# Patient Record
Sex: Female | Born: 1960 | Race: White | Hispanic: No | Smoking: Current every day smoker
Health system: Southern US, Community
[De-identification: ages and names within clinical notes are randomized; demographics above are authoritative.]

## PROBLEM LIST (undated history)

## (undated) DIAGNOSIS — N946 Dysmenorrhea, unspecified: Secondary | ICD-10-CM

## (undated) DIAGNOSIS — R140 Abdominal distension (gaseous): Secondary | ICD-10-CM

## (undated) DIAGNOSIS — D649 Anemia, unspecified: Secondary | ICD-10-CM

## (undated) DIAGNOSIS — E220 Acromegaly and pituitary gigantism: Secondary | ICD-10-CM

## (undated) DIAGNOSIS — N898 Other specified noninflammatory disorders of vagina: Secondary | ICD-10-CM

## (undated) DIAGNOSIS — K869 Disease of pancreas, unspecified: Secondary | ICD-10-CM

## (undated) DIAGNOSIS — D219 Benign neoplasm of connective and other soft tissue, unspecified: Secondary | ICD-10-CM

## (undated) DIAGNOSIS — R5383 Other fatigue: Secondary | ICD-10-CM

## (undated) DIAGNOSIS — Z780 Asymptomatic menopausal state: Secondary | ICD-10-CM

## (undated) DIAGNOSIS — F32A Depression, unspecified: Secondary | ICD-10-CM

## (undated) DIAGNOSIS — N950 Postmenopausal bleeding: Secondary | ICD-10-CM

## (undated) DIAGNOSIS — F329 Major depressive disorder, single episode, unspecified: Secondary | ICD-10-CM

## (undated) DIAGNOSIS — N921 Excessive and frequent menstruation with irregular cycle: Principal | ICD-10-CM

## (undated) HISTORY — DX: Postmenopausal bleeding: N95.0

## (undated) HISTORY — DX: Abdominal distension (gaseous): R14.0

## (undated) HISTORY — DX: Other fatigue: R53.83

## (undated) HISTORY — DX: Benign neoplasm of connective and other soft tissue, unspecified: D21.9

## (undated) HISTORY — PX: OTHER SURGICAL HISTORY: SHX169

## (undated) HISTORY — DX: Other specified noninflammatory disorders of vagina: N89.8

## (undated) HISTORY — DX: Asymptomatic menopausal state: Z78.0

## (undated) HISTORY — DX: Excessive and frequent menstruation with irregular cycle: N92.1

## (undated) HISTORY — PX: BRAIN SURGERY: SHX531

## (undated) HISTORY — DX: Dysmenorrhea, unspecified: N94.6

## (undated) HISTORY — DX: Acromegaly and pituitary gigantism: E22.0

## (undated) HISTORY — DX: Disease of pancreas, unspecified: K86.9

---

## 2002-09-17 ENCOUNTER — Ambulatory Visit (HOSPITAL_COMMUNITY): Admission: RE | Admit: 2002-09-17 | Discharge: 2002-09-17 | Payer: Self-pay | Admitting: Unknown Physician Specialty

## 2002-09-17 ENCOUNTER — Encounter: Payer: Self-pay | Admitting: Unknown Physician Specialty

## 2004-01-27 ENCOUNTER — Ambulatory Visit (HOSPITAL_COMMUNITY): Admission: RE | Admit: 2004-01-27 | Discharge: 2004-01-27 | Payer: Self-pay | Admitting: Family Medicine

## 2004-09-02 ENCOUNTER — Encounter (HOSPITAL_COMMUNITY): Admission: RE | Admit: 2004-09-02 | Discharge: 2004-10-02 | Payer: Self-pay | Admitting: Oncology

## 2004-09-02 ENCOUNTER — Encounter: Admission: RE | Admit: 2004-09-02 | Discharge: 2004-09-02 | Payer: Self-pay | Admitting: Oncology

## 2004-10-21 ENCOUNTER — Ambulatory Visit (HOSPITAL_COMMUNITY): Admission: RE | Admit: 2004-10-21 | Discharge: 2004-10-21 | Payer: Self-pay | Admitting: Internal Medicine

## 2004-12-10 ENCOUNTER — Inpatient Hospital Stay (HOSPITAL_COMMUNITY): Admission: RE | Admit: 2004-12-10 | Discharge: 2004-12-15 | Payer: Self-pay | Admitting: Neurosurgery

## 2004-12-10 ENCOUNTER — Encounter (INDEPENDENT_AMBULATORY_CARE_PROVIDER_SITE_OTHER): Payer: Self-pay | Admitting: *Deleted

## 2004-12-18 ENCOUNTER — Emergency Department (HOSPITAL_COMMUNITY): Admission: EM | Admit: 2004-12-18 | Discharge: 2004-12-18 | Payer: Self-pay | Admitting: Emergency Medicine

## 2005-03-22 ENCOUNTER — Ambulatory Visit (HOSPITAL_COMMUNITY): Admission: RE | Admit: 2005-03-22 | Discharge: 2005-03-22 | Payer: Self-pay | Admitting: Family Medicine

## 2005-11-30 ENCOUNTER — Ambulatory Visit (HOSPITAL_COMMUNITY): Admission: RE | Admit: 2005-11-30 | Discharge: 2005-11-30 | Payer: Self-pay | Admitting: Family Medicine

## 2005-12-01 ENCOUNTER — Ambulatory Visit (HOSPITAL_COMMUNITY): Admission: RE | Admit: 2005-12-01 | Discharge: 2005-12-01 | Payer: Self-pay | Admitting: Family Medicine

## 2007-03-06 ENCOUNTER — Ambulatory Visit (HOSPITAL_COMMUNITY): Admission: RE | Admit: 2007-03-06 | Discharge: 2007-03-06 | Payer: Self-pay | Admitting: Family Medicine

## 2007-09-07 ENCOUNTER — Encounter (INDEPENDENT_AMBULATORY_CARE_PROVIDER_SITE_OTHER): Payer: Self-pay | Admitting: Neurosurgery

## 2007-09-07 ENCOUNTER — Inpatient Hospital Stay (HOSPITAL_COMMUNITY): Admission: RE | Admit: 2007-09-07 | Discharge: 2007-09-12 | Payer: Self-pay | Admitting: Neurosurgery

## 2007-12-14 ENCOUNTER — Emergency Department (HOSPITAL_COMMUNITY): Admission: EM | Admit: 2007-12-14 | Discharge: 2007-12-14 | Payer: Self-pay | Admitting: Emergency Medicine

## 2008-11-25 ENCOUNTER — Ambulatory Visit: Payer: Self-pay | Admitting: Internal Medicine

## 2008-12-04 ENCOUNTER — Encounter: Payer: Self-pay | Admitting: Internal Medicine

## 2008-12-04 ENCOUNTER — Ambulatory Visit (HOSPITAL_COMMUNITY): Admission: RE | Admit: 2008-12-04 | Discharge: 2008-12-04 | Payer: Self-pay | Admitting: Internal Medicine

## 2008-12-04 ENCOUNTER — Ambulatory Visit: Payer: Self-pay | Admitting: Internal Medicine

## 2008-12-04 HISTORY — PX: COLONOSCOPY: SHX174

## 2010-08-05 ENCOUNTER — Ambulatory Visit (HOSPITAL_COMMUNITY): Admission: RE | Admit: 2010-08-05 | Discharge: 2010-08-05 | Payer: Self-pay | Admitting: Family Medicine

## 2010-12-13 ENCOUNTER — Encounter: Payer: Self-pay | Admitting: Family Medicine

## 2010-12-23 ENCOUNTER — Encounter: Payer: Self-pay | Admitting: Endocrinology

## 2011-04-06 NOTE — Op Note (Signed)
NAME:  Vanessa Stokes, Vanessa Stokes              ACCOUNT NO.:  [id number]   MEDICAL RECORD NO.:  [id number]          PATIENT TYPE:  AMB   LOCATION:  DAY                           FACILITY:  [facility]   PHYSICIAN:  R. Roetta Sessions, M.D. DATE OF BIRTH:  1961/08/01   DATE OF PROCEDURE:  12/04/2008  DATE OF DISCHARGE:                               OPERATIVE REPORT   INDICATIONS FOR PROCEDURE:  A 50 year old lady with intermittent  hematochezia, mother and father may have had colonic polyps, family has  colon cancer and secondary relatives.  Colonoscopy now being done.  Risks, benefits, alternatives, and limitations have been reviewed and  questions answered.  Please see the documentation in the medical record.   PROCEDURE NOTE:  O2 saturation, blood pressure, pulse, and respirations  were monitored throughout the entire procedure.   CONSCIOUS SEDATION:  Versed 5 mg IV and Demerol 75 mg IV in divided  doses.   INSTRUMENT:  Pentax video chip system.   FINDINGS:  Digital rectal exam revealed no abnormalities.  Endoscopic  findings:  The prep was adequate.  Colon:  The colonic mucosa was  surveyed from the rectosigmoid junction through the left transverse,  right colon to the appendiceal orifice, ileocecal valve, and cecum.  These structures were well seen and photographed for the record.  Terminal ileum was intubated to 10 cm.  From this level, the scope was  slowly and cautiously withdrawn.  All previously mentioned mucosal  surfaces were again seen.  The colonic mucosa was well seen and the  patient was noted to have left-sided diverticula.  There was a 5-mm  pedunculated polyp at the splenic flexure, which was cold snared and  recovered.  The remainder of the colonic mucosa and terminal ileum  mucosa appeared normal.  The scope was pulled down to the rectum, where  thorough examination of rectal mucosa including retroflex view of the  anal verge demonstrated friable anal canal and the anal papilla  only.  The patient tolerated the procedure well and was reactive to Endoscopy.   IMPRESSION:  1. Friable anal canal and anal papilla, otherwise normal rectum.  2. Left-sided diverticula polyp at the splenic flexure, status post      cold snared.  The remainder of the colonic mucosa and terminal      ileum mucosa appeared normal.   RECOMMENDATIONS:  1. Anusol HC suppositories one per rectum at bedtime x10 days.  2. Daily Metamucil, Citracal, or fiber supplement (Benefiber can also      be used).  3. Followup on path.  4. Further recommendations to follow.      Jonathon Bellows, M.D.  Electronically Signed     RMR/MEDQ  D:  12/04/2008  T:  12/05/2008  Job:  [id number]   cc:   Mila Homer. Sudie Bailey, M.D.  Fax: [phone]

## 2011-04-06 NOTE — Consult Note (Signed)
NAME:  Vanessa Stokes, Vanessa Stokes              ACCOUNT NO.:  [id number]   MEDICAL RECORD NO.:  [id number]          PATIENT TYPE:  AMB   LOCATION:  DAY                           FACILITY:  [facility]   PHYSICIAN:  R. Roetta Sessions, M.D. DATE OF BIRTH:  [birth year]   DATE OF CONSULTATION:  11/25/2008  DATE OF DISCHARGE:                                 CONSULTATION   PHYSICIAN REQUESTING CONSULTATION:  Mila Homer. Sudie Bailey, M.D.   PHYSICIAN CO-SIGNING NOTE:  Jonathon Bellows, M.D.   REASON FOR CONSULTATION:  Rectal bleeding, family history of colon  cancer.   HISTORY OF PRESENT ILLNESS:  The patient is a 50 year old lady who  presents today for further evaluation of hematochezia.  She also has a  family history of colon cancer in maternal aunt and her paternal  grandmother.  She believes her parents both her mother and father had  polyps, but she is not 100% sure.  She has had intermittent hematochezia  over the last couple of months.  It comes and goes.  One time, it lasted  for about 4 days.  She noted it when she passed the stool or almost  coating the stool.  It was also on the toilet tissue.  She has never had  a colonoscopy.  She denies any rectal pain, abdominal pain, nausea,  vomiting, heartburn, dysphagia, or odynophagia.   CURRENT MEDICATIONS:  1. Paxil 20 mg daily.  2. Ibuprofen 2 per week for knee pain, recently started.   ALLERGIES:  No known drug allergies.   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  She was diagnosed about 4-5  years ago.  She complains of depression.   PAST SURGICAL HISTORY:  Pituitary tumor surgery x2.   FAMILY HISTORY:  Mother deceased at age 68with lung cancer.  Father  deceased at age 106 with lung cancer.  Both were smokers.  She has 2  brothers and sisters who were healthy.   SOCIAL HISTORY:  She is divorced.  She is a Production designer, theatre/television/film at [employer].  She  occasionally smokes.  She occasionally  consumes beer.   REVIEW OF SYSTEMS:  See HPI for GI.  CONSTITUTIONAL:  No weight loss.  CARDIOPULMONARY:  No chest pain, shortness of breath, palpitations, or  cough.  GENITOURINARY:  No dysuria or hematuria.   PHYSICAL EXAMINATION:  VITAL SIGNS:  Weight 156, height 5 feet 4 inches,  temperature 98.3, blood pressure 110/80, and pulse 80.  GENERAL:  A pleasant well-nourished, well-developed Caucasian female in  no acute distress.  SKIN:  Warm and dry.  No jaundice.  HEENT:  Sclera nonicteric.  Oropharyngeal mucosa moist and pink.  No  lesions, erythema, or exudate.  No lymphadenopathy or thyromegaly.  CHEST:  Lungs are clear to auscultation.  CARDIAC:  Regular rate and rhythm.  Normal S1 and S2.  No murmurs, rubs,  or gallops.  ABDOMEN:  Positive bowel sounds.  Abdomen is soft, nontender, and  nondistended.  No organomegaly or masses.  No rebound or guarding.  No  abdominal bruits  or hernia.  LOWER EXTREMITIES:  No edema.   IMPRESSION:  Vanessa Stokes is a 50 year old lady with intermittent hematochezia.  She has a family history of colon cancer, but not in first degree  relatives.  Possible colonic polyps in her mother and father.  I would  recommend diagnostic colonoscopy at this time.  I have discussed risks,  alternatives, benefits with regards to, but not limited to the risk  reaction, medication, bleeding, infection, and perforation.   PLAN:  Colonoscopy in near future with Dr. Jena Gauss.   I would like to thank Dr. John Giovanni for allowing [place] to take part  in the care of this patient.      Tana Coast, P.AJonathon Bellows, M.D.  Electronically Signed    LL/MEDQ  D:  11/25/2008  T:  11/26/2008  Job:  [id number]   cc:   Mila Homer. Sudie Bailey, M.D.  Fax: [phone]   R. Roetta Sessions, M.D.  [address]  [place]  [place] [address]

## 2011-04-06 NOTE — Op Note (Signed)
NAME:  Vanessa Stokes, [name]              ACCOUNT NO.:  [id number]   MEDICAL RECORD NO.:  [id number]          PATIENT TYPE:  INP   LOCATION:  3172                         FACILITY:  [facility]   PHYSICIAN:  Kinnie Scales. Annalee Genta, M.D.DATE OF BIRTH:  1960-12-19   DATE OF PROCEDURE:  09/07/2007  DATE OF DISCHARGE:                               OPERATIVE REPORT   PREOPERATIVE DIAGNOSES:  1. Pituitary tumor.  2. Status post prior transsphenoidal pituitary resection (2006).  3. Right nasal sinus polyp.   SURGICAL PROCEDURES:  1. Revision transseptal transsphenoidal pituitary resection with      computer assisted navigation Nurse, adult).  2. Right endoscopic sinus surgery consisting of anterior ethmoidectomy      and nasal polypectomy.   ANESTHESIA:  General endotracheal.   ESTIMATED BLOOD LOSS:  Approximately 150 mL.   SURGEONS:  1. Hewitt Shorts, M.D. (neurosurgery).  2. Kinnie Scales. Annalee Genta, M.D. (ENT).   The patient is transferred from the operating room to the neurosurgical  recovery room in stable condition.   BRIEF HISTORY:  Vanessa Stokes is a 50 year old white female with a  history of a growth hormone abnormality and progressive acromegalia.  She underwent surgical resection via transseptal transsphenoidal  approach in 2006 performed by Dr. Newell Coral and Dr. Annalee Genta.  The  patient had a transient improvement in growth hormone levels but then  gradually over the last 12 months has had increasing levels of growth  hormone.  She is followed and treated by Dr. Anson Fret and has been  on hormone suppression therapy but despite this, has continued to have  significantly elevated levels and is at high risk for developing long  term organ deficiencies and failure.  She had undergone multiple prior  MRI scans which showed a hypointense signal area in the posterior aspect  of the pituitary fossa found to be consistent with a cystic area and  ulcer consistent with possible hormone secreting  tumor.  Given the  patient's history and examination, she was prepared for surgery and  consented through Dr. Earl Gala office.  She was referred to Dr.  Annalee Genta for additional evaluation and review of the risks associated  with revision transseptal transsphenoidal hypophysectomy.  During that  examination, endoscopy was performed and the patient was found to have a  large right sided nasal sinus polyp.  CT scan of the sinus was then  performed.  The CT was done in a Stealth format for intraoperative  computer assisted navigation. This showed some diffuse mucosal disease  involving the ethmoid and maxillary sinuses and a large soft tissue mass  consistent with an intranasal polyp extending from the posterior ethmoid  region and filling two-thirds of the posterior nasal cavity.  The  patient was otherwise asymptomatic and did not have significant problems  with sinusitis or nasal airway obstruction.  This was thought to be an  unrelated problem from her previous surgery and on endoscopy, the  patient was found to have a very well healed midline nasal septum with  patent bilateral sphenoid sinus ostia noted to discharge mass or CSF  leak.  Given the above history  and findings, the patient was scheduled  for revision transseptal transsphenoidal hypophysectomy with an ENT  approach and Dr. Newell Coral to remove the pituitary tumor.  I also  recommended that we undertake a limited right endoscopic sinus surgery  in order to remove the sinonasal polyp and improve nasal patency and  postoperative healing.  The risks, benefits, and possible complications  of each of these procedures were discussed in detail and the patient  understood and concurred with our plan.  Specifically, she was aware  that she was at increased risk for nasal septal perforation, nasal  septal collapse, possible infection, and a revision sinonasal surgery.  Dr. Newell Coral had also obtained informed consent regarding the   neurosurgical aspect of this surgical procedure.  The patient was  scheduled for surgery on September 07, 2007 in the neurosurgical OR at  [facility].   SURGICAL PROCEDURE:  The patient was brought to the operating room on  the morning of September 07, 2007 and placed in the supine position on the  operating table.  General endotracheal anesthesia was established  without difficulty.  When the patient was adequately anesthetized, she  was positioned on the operating table, prepped and draped in a sterile  fashion.  The patient's nose was then injected with a total of 9 mL of  1% lidocaine 1:100,000 dilution epinephrine which was injected along the  nasal septum bilaterally in a submucosal fashion and also injected along  the anterior nasal columella at the proposed incision site.  The nasal  polyp was then examined directly and was also injected with 1% lidocaine  1:100,000 solution dilution epinephrine.  A  total of 9 mL was used.  The patient's nose was then soaked with Afrin soaked cottonoid pledgets  which were left in place for approximately 10 minutes to allow for  vasoconstriction and hemostasis.  The patient was then fit with the  Stealth computer assisted navigation head gear and the navigation tools  were used throughout the sinus portion of the case as well as the  neurosurgical portion.  The anatomic and surgical landmarks were  identified and confirmed and the device was set up for use during  surgery.   Procedure was begun by creating a right anterior hemitransfixion  incision, secured through the mucosa underlying the submucosa.  Previous  significant scar tissue was encountered in the submucosal region and a  flap was gently dissected with blunt and sharp dissection.  A 15 scalpel  was used to elevate flaps.  A small amount of septal cartilage remained  but the majority of the nasal septal mucosal flaps were adherent and  scarred and several tears were encountered  in the flaps in the act of  developing our anterior to posterior dissection.  A surgical endoscope  was employed in order to gain better visualization and dissection was  carried out to the anterior face in the sphenoid sinus.  This was opened  using Kerrison rongeurs and scar tissue from the previous surgery was  removed.  The intraspinous septum was palpated.  Using the Stealth  navigation tools, the surgical landmarks were identified.  Sphenoid  sinus was fully explored and the anterior face of the sella was  identified.  Previous opening was mobilized and overlying sphenoid sinus  mucosa was lateralized creating a direct access to the pituitary fossa.  The C-arm was also employed in order to gain anterior to posterior  localization and the patient was in good position.  A pituitary  retractor was placed and the operating microscope was moved into  position.  Dr. Newell Coral then performed a pituitary hypophysectomy and is  dictated as a separate operative procedure under his name.   At the conclusion of this portion of the procedure with adequate  resection of the pituitary tumor, there was minimal bleeding and no  evidence of spinal fluid leak.  Various reconstructive options were  discussed and because of the patient's pituitary region was stable, we  opted not to pack fat in the sinus or place any cartilage or bone over  the anterior affect of the sella.  The sphenoid mucosa was coapted in  the midline.  The nasal septal mucosal flaps were then positioned and a  4-0 gut suture on a Keith needle was used in a horizontal mattressing  fashion to reapproximate the mucosal flaps.  There are several tears  which are closed in a layered fashion and the septum appeared to be  intact, in a good position at the conclusion of the procedure.  The  anterior columellar cartilage/septum was then reattached to the  maxillary crest using 4-0 PDS suture in an interrupted fashion.  The  anterior  hemitransfixion incision was closed with 4-0 gut suture on a  Keith needle.  The deep cutaneous closure along the nasal columella was  closed with 4-0 Vicryl suture in an interrupted fashion and the final  skin edges were closed with a 6-0 Ethilon suture in an interrupted  fashion.  The patient's nasal cavity was then thoroughly inspected.  There was no active bleeding and no evidence of spinal fluid leak.  The  patient's septum was in good position.   With the nasal septal and pituitary portions of the procedure completed,  a limited right endoscopic sinus surgery was undertaken.  Using a 0-  degree telescope for visualization and a straight microdebrider,  intranasal polyp was identified.  It was attached superiorly to the  superior meatus and also to the middle meatus superiorly using a  straight microdebrider.  The entire polyp was removed within the nasal  cavity and dissection was then carried out laterally along the posterior  aspect of the ethmoid region.  Attachment was in the anterior ethmoid  and the underlying mucosa was elevated and then resected with  microdebrider, preserving the natural anatomy and middle turbinate.  The  entire polyp resected.  The nasal cavity was irrigated and suctioned.  Bilateral Doyle nasal septal splints were placed after the application  of Bactroban ointment and they were sutured in position with a 3-0  Ethilon suture.  The patient's nasal cavity was then packed bilaterally  with an 8 cm Merocel nasal sponge in each nostril, which again was  pretreated with Bactroban cream and then hydrated with sterile saline.  An orogastric tube was passed and the contents were aspirated.  The  patient was awakened from her anesthetic and she was extubated.  There  was no active bleeding.  She was stable.  She was transferred from the  operating room to the neurosurgical recovery room in stable condition,  no complications.   BLOOD LOSS:  Less than 150  mL.           ______________________________  Kinnie Scales. Annalee Genta, M.D.     DLS/MEDQ  D:  04/54/0981  T:  09/08/2007  Job:  [id number]   cc:   Onalee Hua L. Annalee Genta, M.D.  Alfonse Alpers. Dagoberto Ligas, M.D.

## 2011-04-06 NOTE — Discharge Summary (Signed)
NAME:  Vanessa Stokes, SUMPTER NO.:  [id number]   MEDICAL RECORD NO.:  [id number]          PATIENT TYPE:  INP   LOCATION:  3111                         FACILITY:  [facility]   PHYSICIAN:  Hewitt Shorts, M.D.DATE OF BIRTH:  01/19/1961   DATE OF ADMISSION:  09/07/2007  DATE OF DISCHARGE:  09/12/2007                               DISCHARGE SUMMARY   ADMISSION HISTORY/PHYSICAL EXAMINATION:  Patient is a 50 year old woman  with acromegaly.  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  Followup MRI scan showed residual tumor in the  inferior/posterior  aspect of the sella and Dr. Dagoberto Ligas, her endocrinologist, felt it would  be best to re-resect the residual tumor and; therefore, patient is  admitted for surgery to be done in a combined fashion between myself and  Dr. Annalee Genta from the EMT service.  Examination was notable for the  persistent acromegalic features.  General examination itself was  unremarkable.   HOSPITAL COURSE:  Patient underwent transphenoidal resection of  pituitary tumor with intraoperative frameless stereotactics, endoscopy  and microdissection.  Dr. Annalee Genta also used endoscopic surgery to  perform a nasal polypectomy.  Patient did well following surgery and has  remained neurologically intact, awake, alert, oriented with good vision.  She had only mild discomfort.  She was supported with hydrocortisone  throughout the perioperative and postoperative period.  She did have  some periods of increased urine output and received small doses of  DDAVP, but that has stabilized and she has not required any DDAVP for 35  hours.  She has been transitioned to hydrocortisone 20 mg q.a.m. and 10  mg q.p.m.; Dr. Dagoberto Ligas wants her to be discharged on that dose for  followup next week in his office for consideration of discontinuing the  hydrocortisone.  Dr. Annalee Genta has gone ahead and removed her nasal  packings and wants her to return next week for removal of her nasal  splint; he wants her on Levaquin 500 mg once a day for ten days.  Prescriptions are given for both the Levaquin and the hydrocortisone.  Patient is not using much in the way of pain medications and feels that  she can get by with just a Tylenol at home.  She is to return for  followup with me in about three or four weeks.  She has been given  instructions regarding activities.  She has been instructed that if she  develops excessive thirst, that she is to contact Dr. Dagoberto Ligas if problems  arise.  Pathology report confirms a pituitary adenoma.  It was felt  intraoperatively we were able to remove the area of concern.   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      Hewitt Shorts, M.D.  Electronically Signed     RWN/MEDQ  D:  09/12/2007  T:  09/12/2007  Job:  [id number]   cc:  Alfonse Alpers. Dagoberto Ligas, M.D.  Kinnie Scales. Annalee Genta, M.D.

## 2011-04-06 NOTE — Op Note (Signed)
NAME:  Vanessa Stokes, Vanessa Stokes NO.:  [id number]   MEDICAL RECORD NO.:  [id number]          PATIENT TYPE:  INP   LOCATION:  3172                         FACILITY:  [facility]   PHYSICIAN:  Hewitt Shorts, M.D.DATE OF BIRTH:  January 04, 1961   DATE OF PROCEDURE:  09/07/2007  DATE OF DISCHARGE:                               OPERATIVE REPORT   PREOPERATIVE DIAGNOSES:  1. Pituitary tumor.  2. Acromegaly.   POSTOPERATIVE DIAGNOSES:  1. Pituitary tumor.  2. Acromegaly.   PROCEDURE:  Transsphenoidal resection of pituitary tumor with  intraoperative frameless stereotaxis, rigid sinus endoscopy and  microdissection.   COSURGEONS:  1. Hewitt Shorts, M.D.  2. Kinnie Scales. Annalee Genta, M.D.   ANESTHESIA:  General endotracheal.   INDICATIONS:  The patient is a 50 year old woman who presented with  acromegaly in the fall of 2005.  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  She did well from that surgery and with substantial  decrease in her growth hormone and somatomedin-C levels.  However, mild  acromegaly persisted.  We treated this with medications but with little  response.  Followup MRI scans showed residual tumor in the inferior-  posterior aspect of the sella and her endocrinologist felt that it would  be best to proceed with re-resection of the residual tumor and the  patient is, therefore, brought to surgery.   This procedure was done in the combined fashion between Dr. Osborn Coho of the ENT service and myself, and this is a dictation of the  neurosurgical portion of the procedure.  Dr. Annalee Genta is going to  dictate the ENT portion of the procedure.   PROCEDURE:  The patient brought to the operating room, placed under  general endotracheal anesthesia.  The patient was placed in a horseshoe  head rest and then we prepped the abdomen with Betadine soaping  solution, draped in a sterile  fashion in case we wished to obtain some  fat graft.  Dr. Annalee Genta had obtained a CT scan which he loaded into  the frameless stereotactic unit and did the necessary setup for  intraoperative stereotactic navigation.  The face including nose and  nares were prepped and Dr. Annalee Genta proceeded with a submucosal  transeptal approach through the nasal cavity to the sphenoid sinus.  He  encountered significant scarring within the dissection plane due to her  previous surgery.  With the rigid sinus endoscope, he is able to  visualize the exposure and eventually was able to dissect into the  sphenoid sinus.  He initially identified the area of the previous  opening into the sella and the scar tissue and sinus mucosa was  dissected away from this area, pushing it towards the lateral aspect of  this sinus.  Once we had the exposure of the previous opening into the  sella, I took over as the primary surgeon.  The microscope was draped  and brought into the field to provide additional magnification,  illumination and visualization and the tumor resection was done with  microdissection and microsurgical technique.  Using the C-arm  fluoroscope which had been set up, we identified the superior and  inferior aspects of the sella.  We then began to dissect some of the  scar tissue that was present but included some fat tissue and then  incised into the inferior aspect of the scar tissue and gland and began  to in a piecemeal fashion to remove the inferior portion of the gland  and continued posteriorly through the gland until we reached the  posterior-inferior aspect of the sella which is where the residual tumor  was present using a variety of loupe microcurets.  We were able to  remove tissue that clearly appeared to be tumor and portions of the  tissue were removed both from anteriorly as well as posteriorly was sent  to pathology for permanent examination.  This will most likely be a  combination  of scar tissue of some pituitary gland and the pituitary  tumor with the C-arm providing US guidance about the extent of removal,  it was felt that we had removed the tumor tissue from the posterior-  inferior aspect of the sella and we were then able to sweep the micro-  ring curet to assure that all of the soft residual tumor had been  removed and the great majority of the gland had been preserved and left  intact.  Hemostasis was established with the use of both bipolar cautery  as well as Gelfoam soaked in thrombin.  We were able to remove the  Gelfoam.  It was irrigated with saline and good hemostasis had been  established.  At no time did we see any evidence of CSF leakage but we  did see good pulsation of the remaining pituitary tissue.   Dr. Annalee Genta found little if any residual septal cartilage or bone and  therefore we did not have any tissue to buttress across the opening of  the sella.  It was felt that any soft tissue placed very well would fall  back out of the sella and there is really no way to close the opening  and in the absence of any CSF leakage, more definitive closure was not  needed.  We, therefore, went ahead and turned the primary surgeon  responsibility back over to Dr. Annalee Genta.  He subsequently proceeded  with a nasal polypectomy and closure and his approach and closure  portions of the procedure will be dictated by him separately.  Subsequently, the patient is to be transferred to the recovery room and  then to the neurosurgical intensive care unit for further care.  The  estimated blood loss was 100 mL.  Sponge and needle count were correct.      Hewitt Shorts, M.D.  Electronically Signed     RWN/MEDQ  D:  09/07/2007  T:  09/08/2007  Job:  [id number]   cc:   Hewitt Shorts, M.D.  Kinnie Scales. Annalee Genta, M.D.

## 2011-04-06 NOTE — H&P (Signed)
NAME:  Vanessa Stokes, Vanessa Stokes NO.:  [id number]   MEDICAL RECORD NO.:  [id number]          PATIENT TYPE:  INP   LOCATION:  2899                         FACILITY:  [facility]   PHYSICIAN:  Hewitt Shorts, M.D.DATE OF BIRTH:  [birth year]   DATE OF ADMISSION:  09/07/2007  DATE OF DISCHARGE:                              HISTORY & PHYSICAL   HISTORY OF PRESENT ILLNESS:  The patient is a 50 year old right-handed  white female who was found in the fall 2005, to have acromegaly. She  underwent endocrinologic workup and MRI scan.  The MRI revealed a  pituitary macroadenoma.  The patient has had gradually increasing hand  and foot size as well as developed of acromegalic facial features.  The  patient underwent transsphenoidal resection of pituitary tumor in  January 2006 and did well from that surgery and she has been followed  endocrinologically by Dr. Corrin Parker, although her serum growth  hormone level and somatomedin C level diminished, they remained  increased and therefore Dr. Dagoberto Ligas has been treating her with  Sandostatin.  Unfortunately she initially had some difficulties with  compliance, but even when compliant, the patient has continued to have  persistent chemical acromegaly as well as persistence of her acromegalic  features and therefore we restudied her with MRI. That study showed area  of hyperdensity in the posterior inferior aspect of the sella and that  is felt to represent residual tumor.  Discussing the management with Dr.  Dagoberto Ligas, her endocrinologist, he feels that is going to require re-  resection and as explained to both the patient and Dr. Dagoberto Ligas that in  removing that tissue, she certainly runs the risk of panhypopituitarism  and diabetes insipidus.  Dr. Dagoberto Ligas feels that he will be able to manage  that but that despite maximal medical therapy, he has not been able to  control her acromegaly and therefore he favors transsphenoidal resection  of her  pituitary tumor. The patient is admitted now for such.   PAST MEDICAL HISTORY:  Notable for a history of depression as well as  for her acromegaly but is otherwise unremarkable.  She has no history of  hypertension, myocardial infarction, cancer, stroke, diabetes, peptic  ulcer disease, lung disease.   PAST SURGICAL HISTORY:  Her only previous surgery was a transsphenoidal  resection of a pituitary tumor in January 2006.   ALLERGIES:  SHE DENIES ALLERGIES TO MEDICATIONS.   CURRENT MEDICATIONS:  Paxil 20 mg daily.   FAMILY HISTORY:  Father died of lung cancer at age 51.  Mother also had  lung cancer.   SOCIAL HISTORY:  The patient is divorced.  She smoked for a long time.  She does drink alcoholic beverages.  She does not have a history of  substance abuse.   REVIEW OF SYSTEMS:  Notable for those described in history of present  illness, past medical history was underwent unremarkable.   PHYSICAL EXAMINATION:  GENERAL:  The patient a well-developed, well-  nourished white female in no acute distress.  VITAL SIGNS:  Her height is 5 feet 4, weight 145 pounds, temperature  98.2,  pulse 65, blood pressure 128/82.  LUNGS:  Clear to auscultation.  She has symmetrical respiratory  excursion.  HEART:  Regular rate and rhythm. Normal S1 and S2  There is no murmur.  EXTERNAL:  Shows obvious acromegalic features of her face, hands, feet  and so on.  NEUROLOGIC:  The patient is awake, alert, fully oriented.  Her speech  fluent.  She has good comprehension.  Cranial nerves show:  Pupils  equal, round and reactive to light.  Extraocular movements are intact.  Facial movement is symmetrical.  Hearing is present bilaterally, palatal  movement is symmetrical, shoulder shrug is symmetrical.  Tongue is  midline.  Motor examination shows 5/5 strength in the upper and lower  extremities.  Sensation is intact to pinprick throughout the extremities  and reflexes are symmetrical.   IMPRESSION:   Residual pituitary tumor with persistent acromegaly.   PLAN:  The patient will be admitted for transsphenoidal resection of  pituitary tumor with the assistance of Dr. Osborn Coho from the ENT  service.  This will be done as a co-surgery operation with Dr. Annalee Genta  providing exposure into the pseudosinus and into the sellar wall.  I  will resect the pituitary tumor and then Dr. Annalee Genta will proceed with  closure.  I have discussed the nature of surgery and its risks with the  patient at length, including risks of infection, including that of  meningitis, risks of bleeding, possibly transfusion, the risks of  neurologic damage including stroke, risk of endocrinopathy including  hypopituitarism and the possibility for life-time hormone replacement  for diabetes insipidus as well as replacement of her thyroid, adrenal,  and gonadal axes.  We also discussed risk of loss of vision and CSF  leak, as well as death.  Understanding all of this, she wishes to go  ahead with surgery and is admitted now for such.      Hewitt Shorts, M.D.  Electronically Signed     RWN/MEDQ  D:  09/07/2007  T:  09/07/2007  Job:  [id number]   cc:   Onalee Hua L. Annalee Genta, M.D.  Alfonse Alpers. Dagoberto Ligas, M.D.

## 2011-04-09 NOTE — Op Note (Signed)
NAME:  Vanessa Stokes, Vanessa Stokes NO.:  [id number]   MEDICAL RECORD NO.:  [id number]          PATIENT TYPE:  INP   LOCATION:  2899                         FACILITY:  [facility]   PHYSICIAN:  Hewitt Shorts, M.D.DATE OF BIRTH:  12-19-1960   DATE OF PROCEDURE:  12/10/2004  DATE OF DISCHARGE:                                 OPERATIVE REPORT   REFERRING PHYSICIAN:  Ladona Horns. Mariel Sleet, M.D.   PREOPERATIVE DIAGNOSIS:  Pituitary macroadenoma and acromegaly.   POSTOPERATIVE DIAGNOSIS:  Pituitary macroadenoma and acromegaly.   PROCEDURE:  Transsphenoidal resection of pituitary tumor with harvesting of  abdominal fat graft.   SURGEONS:  1.  Hewitt Shorts, M.D.  2.  Kinnie Scales. Annalee Genta, M.D.   INDICATIONS FOR PROCEDURE:  The patient is a 50 year old woman who presents  with acromegaly who was found by MRI to have a pituitary macroadenoma  extending into the suprasellar cistern abutting but not compressing the  optic chiasm.  Decision was made to proceed with transsphenoidal resection  of the pituitary tumor.  Clinically the patient was acromegalic.   Procedure was done in the combined fashion between the neurosurgical and ENT  services.  The approach and reconstruction was performed by Dr. Annalee Genta  and the tumor resection was performed by myself. This is a dictation of the  neurosurgical portion of the procedure.   DESCRIPTION OF PROCEDURE:  The patient was brought to the operating room and  placed under general endotracheal anesthesia.  The patient was placed in a  horseshoe headrest. The C-arm fluoroscopic unit was set up to provide  intraoperative guidance and then Dr. Annalee Genta prepped the nasal facial  region.  The nursing staff prepped the abdomen with Betadine soap and  solution which was then draped in a sterile fashion.  Dr. Annalee Genta  performed an approach transnasally to the sphenoid sinus and once  positioned, the C-arm fluoroscopic unit was used to confirm  our  localization. The septum was noted to be slightly off to the left side which  helped with our orientation.  At this point, I took over the procedure.  The  right side of the abdomen was exposed and the overlying skin infiltrated  with local anesthetic with epinephrine.  A horizontal incision was made,  carried down through subcutaneous tissue where a fat graft was obtained.  Bipolar cautery and electrocautery used to maintain hemostasis.  Once the  fat graft was obtained and hemostasis established, we proceeded with  closure.  The subcutaneous and subcuticular closure interrupted inverted 2-0  Vicryl sutures.  Skin was closed with Dermabond.  We then examined the  anterior wall of the sella.  We did not open it with a small bone curette  and therefore the X-Max drill was set up with a diamond bit bur and we were  able to create a small opening into the sella leaving the dura intact.  Then  using a variety of 45 degree up and down angled Kerrison punches, we  extended the opening superiorly, inferiorly and laterally.  The overlying  dura was then coagulated and incised with a #15 scalpel  in an X shaped  fashion.  This was extended with microscissors.  The tumor itself was soft  and liquidy and using a sputum trap and a 45 degree small ring curette, we  were able to proceed with a tumor resection.  Tumor was subsequently sent to  pathology in formalin for permanent section.  We proceeded with a tumor  resection removing all of the loose tumor.  The dura which had been  initially relatively taut was slack and loose and we identified what we  suspected to be the gland superiorly and to the left side.  Good pulsations  were noted.  Hemostasis established with the use of bipolar cautery on the  edges of the dura.  We then took a small 5 x 5 x 5 mm piece of fat which was  placed in the resection cavity intradurally and then a small piece of nasal  septal cartilage was cut and shaped to size  and positioned within the edges  of the wall of the bony opening of the anterior wall of the sella and  positioned extradurally.  Once the cartilage was positioned, the procedure  was turned back over to Dr. Annalee Genta for closure.  He was going to pack fat  tissue over the opening into the sella, closed the mucosa over that and then  pack up against that.   Following surgery, the patient is to be transferred to the recovery room for  further care after being extubated.  Estimated blood loss was approximately  50 mL.  The initial sponge and needle counts were correct, although final  counts will be done at the completion of Dr. Thurmon Fair portion of the  procedure.      Robe   RWN/MEDQ  D:  12/10/2004  T:  12/10/2004  Job:  [id number]   cc:   Ladona Horns. Neijstrom, MD  [address]  [place]  [place] [address]  Fax: [phone]   Kinnie Scales. Annalee Genta, M.D.  [address] [place]  [place] [address]  Fax: [phone]

## 2011-04-09 NOTE — Discharge Summary (Signed)
NAME:  Vanessa Stokes, Vanessa Stokes NO.:  [id number]   MEDICAL RECORD NO.:  [id number]          PATIENT TYPE:  INP   LOCATION:  3111                         FACILITY:  [facility]   PHYSICIAN:  Hewitt Shorts, M.D.DATE OF BIRTH:  Aug 23, 1961   DATE OF ADMISSION:  12/10/2004  DATE OF DISCHARGE:  12/15/2004                                 DISCHARGE SUMMARY   ADMISSION HISTORY AND PHYSICAL EXAMINATION:  The patient is a 50 year old  woman who was found to have acromegaly, both clinically as well as by  laboratories.  MRI scan revealed a large pituitary macroadenoma extending up  into the suprasellar cistern and abutting but not compressing the optic  apparatus.  Visual fields were intact.  General examination was notable for  findings consistent with acromegaly.  Neurologic examination was intact.   HOSPITAL COURSE:  The patient was admitted and underwent transsphenoidal  resection of pituitary tumor with harvesting of abdominal fat graft.  There  was a combined procedure between the neurosurgical and ENT services, with  Dr. Newell Coral and Dr. Annalee Genta, respectively.  Postoperatively, she has done  well, without evidence of diabetes insipidus.  She has been mobilizing  fluids, and some of the doughiness of her hands and feet has steadily  diminished.  She has mobilized third space fluids.  Neurologically, she has  otherwise remained intact.  She has been up and ambulating actively in the  halls.  She is afebrile.  Dr. Annalee Genta removed her nasal packings on the  fourth postoperative day but left in her nasal splints.  He has requested  that she return for followup in two weeks.  She is to  return to my office in two to three weeks, and she is to return to the  office of Dr. Margaretmary Bayley, her endocrinologist, in one month.   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      RWN/MEDQ  D:  12/15/2004  T:  12/15/2004  Job:  [id number]   cc:   Onalee Hua L.  Annalee Genta, M.D.  [address] [place]  [place] [address]  Fax: [phone]   Hewitt Shorts, M.D.  [address]  [place]  [place] [address]  Fax: [phone]   Margaretmary Bayley, M.D.  [address]  [place]  [place] [address]  Fax: [phone]

## 2011-04-09 NOTE — H&P (Signed)
NAME:  Vanessa Stokes, PLAISTED NO.:  [id number]   MEDICAL RECORD NO.:  [id number]          PATIENT TYPE:  INP   LOCATION:  2899                         FACILITY:  [facility]   PHYSICIAN:  Hewitt Shorts, M.D.DATE OF BIRTH:  [birth year]   DATE OF ADMISSION:  12/10/2004  DATE OF DISCHARGE:                                HISTORY & PHYSICAL   HISTORY OF PRESENT ILLNESS:  The patient is a 50 year old right handed white  female who was evaluated for a gross hormone secretive pituitary  macroadenoma. The patient was found to have a prominent hand size and was  referred to Dr. Margaretmary Bayley for endocrinologic work up and was diagnosed  with acromegaly. Visual fields showed no defect.  MRI of the cella shows a  pituitary macroadenoma. The patient notes that she had increasing hand size  although it has occurred gradually and she is more aware of it in  retrospect.  Her shoe size has increased over the past 3 years from a size 8  to a size 9.5.  She has had headaches all of her life but they have been  worse over the past year.  She denies any breast discharge, change in her  weight, or diplopia. She does describe some mild blurred vision. She denies  any shortness of breath, dyspnea or chest pain, no gastrointestinal  symptoms.   PAST MEDICAL HISTORY:  1.  Notable for depression treated for the past 10 years.  2.  No history of hypertension, myocardial infarction, cancer, stroke,      diabetes, peptic ulcer disease or lung disease.   SURGERIES:  No previous surgeries.   ALLERGIES:  No known drug allergies.   MEDICATIONS:  Paxil 20 mg daily is her only medication.   FAMILY HISTORY:  Her father died of lung cancer at age 61. Her mother is  under treatment for lung cancer at age 18.   SOCIAL HISTORY:  The patient is divorced. She works as a [employer].  She smokes a half-pack a day, has been a smoker for 20 years. She drinks  alcohol socially. She denies history of  substance abuse.   REVIEW OF SYSTEMS:  Notable for what has been described above and is  otherwise unremarkable.   PHYSICAL EXAMINATION:  GENERAL:  The patient is a well-developed, well-  nourished white female in no acute distress.  VITAL SIGNS:  Temperature is 97, pulse 65, blood pressure 123/87,  respiratory rate 16. Height is 5 feet, 4 inches, weight 164 pounds.  LUNGS:  Clear to auscultation. She has symmetric respiratory excursions.  HEART:  Regular rate and rhythm S1, S2, there is no murmur.  ABDOMEN:  Soft, nondistended. Bowel sounds are present.  EXTREMITIES:  Examination shows prominent, somewhat doughy hands and feet,  there is no clubbing, cyanosis or edema.  NEUROLOGIC:  Mental status - the patient is wake, alert, fully oriented.  Speech is fluent and she has good comprehension. Cranial nerves show pupils  to be equal, round and reactive to light.  Visual fields are intact to  confrontation. Extraocular movements are intact. Facial movement  symmetrical. Hearing is present bilaterally. Palate movement is symmetric,  the tongue is midline.  She has symmetrical shoulder shrug.  Motor  examination shows 5 out of 5 strength, there is no drift to the upper  extremities, sensation is intact to pin prick throughout. Reflexes are trace  at the knees, biceps and brachialis and triceps, 2 to 3 in the quadriceps,  trace to 1 in the gastrocnemius and symmetrical. Toes are down going  bilaterally. She has a normal gait and stance.   Additional examination reveals she has somewhat coarse facial features.   DISCHARGE DIAGNOSIS:  MRI of the cella from [facility] shows a non  enhancing mass within the cella extending into the super cella cistern which  abuts but does not compress the optic nerves in chiasm.   IMPRESSION:  Patient with acromegaly secondary to a growth hormone secreting  pituitary macroadenoma with intact visual fields.   PLAN:  The patient will be admitted for  transnodal resection of the  pituitary macroadenoma. She understands that there may be some local  invasion and that further treatment may be necessary.  She understands with  surgical resection there is a moderate likelihood of postoperative hormonal  replacement for various pituitary axis potentially for the remainder of her  life.  On the other hand, she understands that left untreated the tumor will  continue to grow, causing increasing damage from the acromegaly itself as  well as to the optic apparatus and other general risks. The patient does  wish to be admitted for surgery.  She has been seen by Dr. Osborn Coho  from ENT service for preoperative evaluation.  The procedure will be done in  a combined fashion between the ENT/neurosurgical services for transnodal  resection of pituitary tumor. We discussed the nature of the surgical  operation, the limitations postoperatively and risks which include  infection, bleeding, possible need for transfusion. The risk of neurologic  dysfunction with loss of vision, paralysis of extraocular movements,  paralysis, coma and death. We discussed the possibility for postoperative  hormonal replacement, possible need for further treatment, myocardial  infarction, stroke or death. After I said all of this she does wish to  proceed with surgery and is admitted for such.      Robe   RWN/MEDQ  D:  12/10/2004  T:  12/10/2004  Job:  [id number]

## 2011-04-09 NOTE — Op Note (Signed)
NAME:  Vanessa Stokes, Vanessa Stokes              ACCOUNT NO.:  [id number]   MEDICAL RECORD NO.:  [id number]          PATIENT TYPE:  INP   LOCATION:  3111                         FACILITY:  [facility]   PHYSICIAN:  Kinnie Scales. Annalee Genta, M.D.DATE OF BIRTH:  [birth year]   DATE OF PROCEDURE:  12/10/2004  DATE OF DISCHARGE:  12/15/2004                                 OPERATIVE REPORT   PREOPERATIVE DIAGNOSIS:  Pituitary microadenoma with acromegaly.   POSTOPERATIVE DIAGNOSIS:  Pituitary microadenoma with acromegaly.   SURGICAL PROCEDURE:  Transseptal, transsphenoidal approach for resection of  pituitary adenoma.   SURGEONS:  Kinnie Scales. Annalee Genta, M.D., and Hewitt Shorts, M.D.   ANESTHESIA:  General endotracheal.   No complications.   ESTIMATED BLOOD LOSS:  Approximately 100 mL.   Patient transferred from the operating room to the recovery room in stable  condition.   BRIEF HISTORY:  Vanessa Stokes is a 50 year old white female with a history of  progressive hand and foot enlargement.  Evaluation and workup showed a  pituitary microadenoma and physical findings consistent with acromegaly.  She was referred to Dr. Shirlean Kelly for evaluation and surgical  management of the disease.  ENT service was consulted for approach for  transseptal, transsphenoidal hypophysectomy.  The patient was seen  preoperatively and the risks, benefits and possible complications of the  nasal component of the surgical procedure were discussed in detail.  The  patient understood and concurred with our plan for surgical procedure, which  was scheduled on December 10, 2004.   SURGICAL PROCEDURE:  The patient was brought to the operating room in the  neurosurgical OR suite at [facility] on December 10, 2004.  She was  positioned on the operating table and prepped and draped in a sterile  fashion.  Preoperative CT scanning and MRI scanning were reviewed and the  patient's surgical procedure discussed with Dr.  Newell Coral.  The patient's  nasal cavity was injected with a total of 7 mL of 1% lidocaine and 1:100,000  solution of epinephrine, which was injected in a submucosal fashion along  the nasal septum, nasal floor and lateral nasal walls.  The nose was then  packed with Afrin-soaked cottonoid pledgets that were left in place for  approximately 10 minutes to allow for vasoconstriction and anesthesia.  She  was then prepped and draped in a sterile fashion.  The surgical procedure  was begun by creating a septal transfixion incision.  This was carried  through the mucosa, underlying submucosa, and the mucoperichondrium was  elevated from anterior to posterior along the patient's nasal septum.  The  septal cartilage was crossed along the inferior maxillary crest and the  entire nasal septum was lateralized, elevating incision along the floor of  the nose and moving the entire septum to the right-hand side.  The mid- and  posterior aspect of the nasal septum were then exposed and using through-  cutting forceps and a 4 mm osteotome, the septal bone and cartilage were  resected, leaving the anterior dorsal columellar cartilage intact for  reconstruction.  A large midseptal portion of cartilage was removed and  used  for reconstruction of the neurosurgical procedure.  Using crosstable x-rays,  the anatomic location of the sphenoid sinus was confirmed and the anterior  face of the sphenoid sinus was opened and the sphenoid sinus septum was then  partially resected.  Again localization was confirmed with a C-arm  fluoroscopy, and the anterior face of the pituitary fossa was identified.  Overlying mucosa and bone were resected and the surgical procedure was  turned over to Dr. Shirlean Kelly for resection of the pituitary tumor.   When the tumor had been completely resected, there was no significant  bleeding and no evidence of active spinal fluid leakage, the patient's  pituitary hypophysis and sphenoid  sinus were then packed with fat.  A small  piece of nasal septal cartilage was placed on the posterior superior aspect  of the sphenoid sinus to replace the bony defect over the sella and the  mucosa was draped over the surgical site.  The patient's nasal cavity and  septum were then reconstructed, the remaining septal cartilage removed to  the mucoperichondrial pocket, and the mucoperichondrial flaps were  reapproximated using a 4-0 gut suture on a Keith needle in a horizontal  mattressing fashion, closing the entire septal approach.  The nasal septal  columella was then reapproximated to the anterior maxillary crest for nasal  stability and the anterior soft tissue incision was closed with a  combination of 5-0 Vicryl sutures and 6-0 Ethilon sutures to close the  anterior skin at the level of the nasal columella with good reapproximation.  Bilateral Doyle nasal septal splints were then placed after the application  of Bactroban ointment and sutured in position with a 3-0 Ethilon suture, and  bilateral dorsal packs were placed within the nasal cavity bilaterally.  These were hydrated with saline solution.  The patient's nasal cavity was  inspected.  There was no evidence of active bleeding.  The oral cavity and  oropharynx were irrigated and suctioned.  She was awakened from her  anesthetic, extubated and then transferred from the operating room to the  recovery room in stable condition.   The patient was stable at the conclusion of her surgical procedure.  The  neurosurgical component of the operation will be dictated as a separate  operative note by Dr. Newell Coral.      DLS/MEDQ  D:  16/08/9603  T:  02/11/2005  Job:  [id number]

## 2011-08-12 LAB — DIFFERENTIAL
Basophils Absolute: 0.1
Eosinophils Relative: 1
Lymphocytes Relative: 27
Lymphs Abs: 1.5
Monocytes Absolute: 0.4
Monocytes Relative: 7

## 2011-08-12 LAB — CBC
HCT: 35.8 — ABNORMAL LOW
Hemoglobin: 12
RDW: 13.8

## 2011-08-12 LAB — BASIC METABOLIC PANEL
GFR calc non Af Amer: >60
Glucose, Bld: 157 — ABNORMAL HIGH
Potassium: 3.6
Sodium: 136

## 2011-08-12 LAB — URINALYSIS, ROUTINE W REFLEX MICROSCOPIC
Bilirubin Urine: NEGATIVE
Hgb urine dipstick: NEGATIVE
Nitrite: NEGATIVE
Specific Gravity, Urine: 1.01
pH: 7.5

## 2011-09-01 LAB — BASIC METABOLIC PANEL
BUN: 10
BUN: 11
BUN: 13
BUN: 6
BUN: 7
BUN: 7
BUN: 8
BUN: 8
BUN: 9
BUN: 9
CO2: 21
CO2: 21
CO2: 23
CO2: 26
CO2: 27
CO2: 27
CO2: 28
CO2: 28
CO2: 28
CO2: 30
Calcium: 7.8 — ABNORMAL LOW
Calcium: 8.1 — ABNORMAL LOW
Calcium: 8.4
Calcium: 8.4
Calcium: 8.4
Calcium: 8.6
Calcium: 8.8
Calcium: 8.9
Calcium: 9
Calcium: 9.2
Chloride: 102
Chloride: 105
Chloride: 107
Chloride: 108
Chloride: 108
Chloride: 109
Chloride: 109
Chloride: 112
Chloride: 112
Chloride: 114 — ABNORMAL HIGH
Creatinine, Ser: 0.44
Creatinine, Ser: 0.44
Creatinine, Ser: 0.51
Creatinine, Ser: 0.52
Creatinine, Ser: 0.53
Creatinine, Ser: 0.54
Creatinine, Ser: 0.57
Creatinine, Ser: 0.57
Creatinine, Ser: 0.58
Creatinine, Ser: 0.61
GFR calc Af Amer: >60
GFR calc Af Amer: >60
GFR calc Af Amer: >60
GFR calc Af Amer: >60
GFR calc Af Amer: >60
GFR calc Af Amer: >60
GFR calc Af Amer: >60
GFR calc Af Amer: >60
GFR calc Af Amer: >60
GFR calc Af Amer: >60
GFR calc non Af Amer: >60
GFR calc non Af Amer: >60
GFR calc non Af Amer: >60
GFR calc non Af Amer: >60
GFR calc non Af Amer: >60
GFR calc non Af Amer: >60
GFR calc non Af Amer: >60
GFR calc non Af Amer: >60
GFR calc non Af Amer: >60
GFR calc non Af Amer: >60
Glucose, Bld: 104 — ABNORMAL HIGH
Glucose, Bld: 118 — ABNORMAL HIGH
Glucose, Bld: 118 — ABNORMAL HIGH
Glucose, Bld: 131 — ABNORMAL HIGH
Glucose, Bld: 138 — ABNORMAL HIGH
Glucose, Bld: 139 — ABNORMAL HIGH
Glucose, Bld: 151 — ABNORMAL HIGH
Glucose, Bld: 171 — ABNORMAL HIGH
Glucose, Bld: 200 — ABNORMAL HIGH
Glucose, Bld: 98
Potassium: 3.5
Potassium: 3.7
Potassium: 3.7
Potassium: 3.8
Potassium: 3.8
Potassium: 3.9
Potassium: 4.1
Potassium: 4.1
Potassium: 4.3
Potassium: 4.6
Sodium: 133 — ABNORMAL LOW
Sodium: 137
Sodium: 138
Sodium: 140
Sodium: 140
Sodium: 140
Sodium: 141
Sodium: 142
Sodium: 143
Sodium: 143

## 2011-09-01 LAB — CBC
HCT: 34.1 — ABNORMAL LOW
Hemoglobin: 11.4 — ABNORMAL LOW
MCHC: 33.3
MCV: 91.5
Platelets: 168
RBC: 3.73 — ABNORMAL LOW
RDW: 13.1
WBC: 12.1 — ABNORMAL HIGH

## 2011-09-01 LAB — OSMOLALITY: Osmolality: 284

## 2011-09-01 LAB — URINALYSIS, DIPSTICK ONLY
Bilirubin Urine: NEGATIVE
Glucose, UA: NEGATIVE
Hgb urine dipstick: NEGATIVE
Ketones, ur: NEGATIVE
Nitrite: NEGATIVE
Protein, ur: NEGATIVE
Specific Gravity, Urine: 1.007
Urobilinogen, UA: 0.2
pH: 7

## 2011-09-01 LAB — OSMOLALITY, URINE: Osmolality, Ur: 782

## 2011-09-01 LAB — T4: T4, Total: 6.4

## 2011-09-01 LAB — TSH: TSH: 0.583

## 2011-09-02 LAB — CBC
HCT: 38.6
Hemoglobin: 13
MCHC: 33.8
MCV: 91
Platelets: 200
RBC: 4.24
RDW: 13.6
WBC: 5.2

## 2011-09-02 LAB — TYPE AND SCREEN
ABO/RH(D): O POS
Antibody Screen: NEGATIVE

## 2011-09-02 LAB — ABO/RH: ABO/RH(D): O POS

## 2012-01-24 ENCOUNTER — Other Ambulatory Visit: Payer: Self-pay | Admitting: Adult Health

## 2012-01-24 ENCOUNTER — Other Ambulatory Visit (HOSPITAL_COMMUNITY)
Admission: RE | Admit: 2012-01-24 | Discharge: 2012-01-24 | Disposition: A | Discharge status: Home / Self Care | Payer: BC Managed Care – PPO | Source: Ambulatory Visit | Attending: Obstetrics and Gynecology | Admitting: Obstetrics and Gynecology

## 2012-01-24 DIAGNOSIS — Z01419 Encounter for gynecological examination (general) (routine) without abnormal findings: Secondary | ICD-10-CM | POA: Insufficient documentation

## 2012-02-17 ENCOUNTER — Other Ambulatory Visit: Payer: Self-pay | Admitting: Adult Health

## 2012-02-17 DIAGNOSIS — Z139 Encounter for screening, unspecified: Secondary | ICD-10-CM

## 2012-02-21 ENCOUNTER — Other Ambulatory Visit: Payer: Self-pay | Admitting: Adult Health

## 2012-02-21 ENCOUNTER — Ambulatory Visit (HOSPITAL_COMMUNITY)
Admission: RE | Admit: 2012-02-21 | Discharge: 2012-02-21 | Disposition: A | Discharge status: Home / Self Care | Payer: BC Managed Care – PPO | Source: Ambulatory Visit | Attending: Adult Health | Admitting: Adult Health

## 2012-02-21 DIAGNOSIS — F17200 Nicotine dependence, unspecified, uncomplicated: Secondary | ICD-10-CM

## 2012-02-21 DIAGNOSIS — Z139 Encounter for screening, unspecified: Secondary | ICD-10-CM

## 2012-02-21 DIAGNOSIS — Z1231 Encounter for screening mammogram for malignant neoplasm of breast: Secondary | ICD-10-CM | POA: Insufficient documentation

## 2012-02-21 DIAGNOSIS — Z801 Family history of malignant neoplasm of trachea, bronchus and lung: Secondary | ICD-10-CM | POA: Insufficient documentation

## 2013-04-16 ENCOUNTER — Ambulatory Visit (HOSPITAL_COMMUNITY)
Admission: RE | Admit: 2013-04-16 | Discharge: 2013-04-16 | Disposition: A | Discharge status: Home / Self Care | Payer: BC Managed Care – PPO | Source: Ambulatory Visit | Attending: Family Medicine | Admitting: Family Medicine

## 2013-04-16 ENCOUNTER — Other Ambulatory Visit (HOSPITAL_COMMUNITY): Payer: Self-pay | Admitting: Family Medicine

## 2013-04-16 DIAGNOSIS — R52 Pain, unspecified: Secondary | ICD-10-CM

## 2013-04-16 DIAGNOSIS — M79609 Pain in unspecified limb: Secondary | ICD-10-CM | POA: Insufficient documentation

## 2013-04-28 ENCOUNTER — Encounter (HOSPITAL_COMMUNITY): Payer: Self-pay | Admitting: *Deleted

## 2013-04-28 ENCOUNTER — Emergency Department (HOSPITAL_COMMUNITY)
Admission: EM | Admit: 2013-04-28 | Discharge: 2013-04-29 | Disposition: A | Discharge status: Home / Self Care | Payer: BC Managed Care – PPO | Attending: Emergency Medicine | Admitting: Emergency Medicine

## 2013-04-28 DIAGNOSIS — Z79899 Other long term (current) drug therapy: Secondary | ICD-10-CM | POA: Insufficient documentation

## 2013-04-28 DIAGNOSIS — F17200 Nicotine dependence, unspecified, uncomplicated: Secondary | ICD-10-CM | POA: Insufficient documentation

## 2013-04-28 DIAGNOSIS — L03012 Cellulitis of left finger: Secondary | ICD-10-CM

## 2013-04-28 DIAGNOSIS — F329 Major depressive disorder, single episode, unspecified: Secondary | ICD-10-CM | POA: Insufficient documentation

## 2013-04-28 DIAGNOSIS — F3289 Other specified depressive episodes: Secondary | ICD-10-CM | POA: Insufficient documentation

## 2013-04-28 DIAGNOSIS — D649 Anemia, unspecified: Secondary | ICD-10-CM | POA: Insufficient documentation

## 2013-04-28 DIAGNOSIS — L03019 Cellulitis of unspecified finger: Secondary | ICD-10-CM | POA: Insufficient documentation

## 2013-04-28 HISTORY — DX: Anemia, unspecified: D64.9

## 2013-04-28 HISTORY — DX: Depression, unspecified: F32.A

## 2013-04-28 HISTORY — DX: Major depressive disorder, single episode, unspecified: F32.9

## 2013-04-28 MED ORDER — LIDOCAINE HCL (PF) 2 % IJ SOLN
10.0000 mL | Freq: Once | INTRAMUSCULAR | Status: AC
  Administered 2013-04-28: 10 mL
  Filled 2013-04-28: qty 10

## 2013-04-28 MED ORDER — ONDANSETRON HCL 4 MG PO TABS
4.0000 mg | ORAL_TABLET | Freq: Once | ORAL | Status: AC
  Administered 2013-04-28: 4 mg via ORAL
  Filled 2013-04-28: qty 1

## 2013-04-28 MED ORDER — DOXYCYCLINE HYCLATE 100 MG PO TABS
100.0000 mg | ORAL_TABLET | Freq: Once | ORAL | Status: AC
  Administered 2013-04-28: 100 mg via ORAL
  Filled 2013-04-28: qty 1

## 2013-04-28 MED ORDER — PENICILLIN V POTASSIUM 250 MG PO TABS
500.0000 mg | ORAL_TABLET | Freq: Once | ORAL | Status: AC
  Administered 2013-04-28: 500 mg via ORAL
  Filled 2013-04-28: qty 2

## 2013-04-28 NOTE — ED Notes (Addendum)
Pt noticed pointer finger on left hand swelling last night pt states now the pain is running up her arm. Pts finger is swollen and warm to the touch.

## 2013-04-29 MED ORDER — HYDROCODONE-ACETAMINOPHEN 5-325 MG PO TABS: 2.0000 | ORAL_TABLET | ORAL | Status: DC | PRN

## 2013-04-29 MED ORDER — DOXYCYCLINE HYCLATE 100 MG PO CAPS: 100.0000 mg | ORAL_CAPSULE | Freq: Two times a day (BID) | ORAL | Status: AC

## 2013-04-29 MED ORDER — AMOXICILLIN 500 MG PO CAPS: 500.0000 mg | ORAL_CAPSULE | Freq: Three times a day (TID) | ORAL | Status: DC

## 2013-04-29 MED ORDER — HYDROCODONE-ACETAMINOPHEN 7.5-325 MG PO TABS: 1.0000 | ORAL_TABLET | ORAL | Status: DC | PRN

## 2013-04-29 NOTE — ED Provider Notes (Signed)
Medical screening examination/treatment/procedure(s) were performed by non-physician practitioner and as supervising physician I was immediately available for consultation/collaboration.   Joya Gaskins, MD 04/29/13 [phone]

## 2013-04-29 NOTE — ED Provider Notes (Signed)
History     CSN: [id number]  Arrival date & time 04/28/13  2125   First MD Initiated Contact with Patient 04/28/13 2228      Chief Complaint  Patient presents with  . Finger Injury  . Hand Pain    (Consider location/radiation/quality/duration/timing/severity/associated sxs/prior treatment) Patient is a 52 y.o. female presenting with hand pain. The history is provided by the patient.  Hand Pain This is a new problem. The current episode started yesterday. The problem occurs constantly. The problem has been gradually worsening. Pertinent negatives include no abdominal pain, arthralgias, chest pain, coughing, fatigue, fever or neck pain. Associated symptoms comments: Swelling of the tip of the left index finger. Exacerbated by: palpation. She has tried nothing for the symptoms. The treatment provided no relief.    Past Medical History  Diagnosis Date  . Depression   . Anemia     Past Surgical History  Procedure Laterality Date  . Pituitary gland tumors removed      History reviewed. No pertinent family history.  History  Substance Use Topics  . Smoking status: Current Some Day Smoker  . Smokeless tobacco: Not on file  . Alcohol Use: No    OB History   Grav Para Term Preterm Abortions TAB SAB Ect Mult Living                  Review of Systems  Constitutional: Negative for fever, activity change and fatigue.       All ROS Neg except as noted in HPI  HENT: Negative for nosebleeds and neck pain.   Eyes: Negative for photophobia and discharge.  Respiratory: Negative for cough, shortness of breath and wheezing.   Cardiovascular: Negative for chest pain and palpitations.  Gastrointestinal: Negative for abdominal pain and blood in stool.  Genitourinary: Negative for dysuria, frequency and hematuria.  Musculoskeletal: Negative for back pain and arthralgias.  Skin: Negative.   Neurological: Negative for dizziness, seizures and speech difficulty.  Psychiatric/Behavioral:  Negative for hallucinations and confusion.       Depression    Allergies  Review of patient's allergies indicates no known allergies.  Home Medications   Current Outpatient Rx  Name  Route  Sig  Dispense  Refill  . PARoxetine (PAXIL) 20 MG tablet   Oral   Take 20 mg by mouth every morning.         . vitamin B-12 (CYANOCOBALAMIN) 1000 MCG tablet   Oral   Take 1,000 mcg by mouth 2 (two) times daily.         . vitamin C (ASCORBIC ACID) 500 MG tablet   Oral   Take 500 mg by mouth daily.           BP 114/79  Pulse 77  Temp(Src) 97.9 F (36.6 C) (Oral)  Resp 18  Ht 5' 3.5" (1.613 m)  Wt 142 lb (64.411 kg)  BMI 24.76 kg/m2  SpO2 100%  Physical Exam  Nursing note and vitals reviewed. Constitutional: She is oriented to person, place, and time. She appears well-developed and well-nourished.  Non-toxic appearance.  HENT:  Head: Normocephalic.  Right Ear: Tympanic membrane and external ear normal.  Left Ear: Tympanic membrane and external ear normal.  Eyes: EOM and lids are normal. Pupils are equal, round, and reactive to light.  Neck: Normal range of motion. Neck supple. Carotid bruit is not present.  Cardiovascular: Normal rate, regular rhythm, normal heart sounds, intact distal pulses and normal pulses.   Pulmonary/Chest: Breath sounds normal.  No respiratory distress.  Abdominal: Soft. Bowel sounds are normal. There is no tenderness. There is no guarding.  Musculoskeletal: Normal range of motion.  There is a paronychia of the left index finger. No red streaking. The finger is warm, but not hot. Decrease ROM of the DIP. Good ROM of the other joints. Some DJD changes present.  Lymphadenopathy:       Head (right side): No submandibular adenopathy present.       Head (left side): No submandibular adenopathy present.    She has no cervical adenopathy.  Neurological: She is alert and oriented to person, place, and time. She has normal strength. No cranial nerve deficit or  sensory deficit.  Skin: Skin is warm and dry.  Psychiatric: She has a normal mood and affect. Her speech is normal.    ED Course  Drain paronychia Date/Time: 04/29/2013 12:49 AM Performed by: Kathie Dike Authorized by: Kathie Dike Consent: Verbal consent obtained. Risks and benefits: risks, benefits and alternatives were discussed Consent given by: patient Patient understanding: patient states understanding of the procedure being performed Patient identity confirmed: verbally with patient Time out: Immediately prior to procedure a "time out" was called to verify the correct patient, procedure, equipment, support staff and site/side marked as required. Preparation: Patient was prepped and draped in the usual sterile fashion. Local anesthesia used: yes Anesthesia: digital block Local anesthetic: lidocaine 2% without epinephrine Patient sedated: no Patient tolerance: Patient tolerated the procedure well with no immediate complications. Comments: Culture sent to the lab.   (including critical care time)  Labs Reviewed  WOUND CULTURE   No results found.   No diagnosis found.    MDM  I have reviewed nursing notes, vital signs, and all appropriate lab and imaging results for this patient. Pt has a paronychia of the left index finger. I and D carried out. Culture sent to the lab.  Rx for Norco, amoxil, and doxycycline given to the patient. Pt to have the finger rechecked in 3 days.       Kathie Dike, PA-C 04/29/13 [phone]

## 2013-05-03 LAB — WOUND CULTURE

## 2013-05-03 NOTE — ED Notes (Addendum)
Post ED Visit - Positive Culture Follow-up: Successful Patient Follow-Up  Culture assessed and recommendations reviewed by: [x]  Wes Dulaney, Pharm.D., BCPS []  Celedonio Miyamoto, Pharm.D., BCPS []  Georgina Pillion, Pharm.D., BCPS []  [place], [address].D., BCPS, AAHIVP []  Estella Husk, Pharm.D., BCPS, AAHIVP  Positive urine culture  []  Patient discharged without antimicrobial prescription and treatment is now indicated [x]  Organism is resistant to prescribed ED discharge antimicrobial []  Patient with positive blood cultures  Changes discussed with ED provider: Marlon Pel New antibiotic prescription Bactrim DS 1 tab po BID x 7 days Called to [facility] by Joyce Gross PFM  Contacted patient: Patient notified of positive results, date 05/03/2013, time 1804  Larena Sox 05/03/2013, 6:11 PM

## 2013-05-03 NOTE — Progress Notes (Signed)
  ED Antimicrobial Stewardship Positive Culture Follow Up   [name] is an 52 y.o. female who presented to [facility] on 04/28/2013 with a chief complaint of finger/hand pain.  Chief Complaint  Patient presents with  . Finger Injury  . Hand Pain    Recent Results (from the past 720 hour(s))  WOUND CULTURE     Status: None   Collection Time    04/29/13 12:45 AM      Result Value Range Status   Specimen Description WOUND LEFT 2ND FINGER PARONYCHIA   Final   Special Requests NONE   Final   Gram Stain     Final   Value: RARE WBC PRESENT, PREDOMINANTLY PMN     RARE SQUAMOUS EPITHELIAL CELLS PRESENT     RARE GRAM POSITIVE COCCI     IN PAIRS RARE GRAM NEGATIVE RODS   Culture     Final   Value: MODERATE STAPHYLOCOCCUS AUREUS     Note: RIFAMPIN AND GENTAMICIN SHOULD NOT BE USED AS SINGLE DRUGS FOR TREATMENT OF STAPH INFECTIONS.     MODERATE KLEBSIELLA OXYTOCA   Report Status 05/03/2013 FINAL   Final   Organism ID, Bacteria STAPHYLOCOCCUS AUREUS   Final   Organism ID, Bacteria KLEBSIELLA OXYTOCA   Final    [x]  Treated with Amoxicillin, organism resistant to prescribed antimicrobial []  Patient discharged originally without antimicrobial agent and treatment is now indicated  Recommendation: Stop Amoxicillin. Complete Doxycycline. Start and complete new prescription below.  New antibiotic prescription: Bactrim DS 1 tablet PO BID x 7 days  ED Provider: Marlon Pel, PA-C   Cleon Dew 05/03/2013, 3:36 PM Infectious Diseases Pharmacist Phone# [phone]

## 2013-05-07 MED FILL — Hydrocodone-Acetaminophen Tab 5-325 MG: ORAL | Qty: 6 | Status: AC

## 2013-12-26 ENCOUNTER — Other Ambulatory Visit (HOSPITAL_COMMUNITY): Payer: Self-pay | Admitting: Family Medicine

## 2013-12-26 ENCOUNTER — Ambulatory Visit (HOSPITAL_COMMUNITY)
Admission: RE | Admit: 2013-12-26 | Discharge: 2013-12-26 | Disposition: A | Discharge status: Home / Self Care | Payer: BC Managed Care – PPO | Source: Ambulatory Visit | Attending: Family Medicine | Admitting: Family Medicine

## 2013-12-26 DIAGNOSIS — M21859 Other specified acquired deformities of unspecified thigh: Secondary | ICD-10-CM | POA: Insufficient documentation

## 2013-12-26 DIAGNOSIS — M25559 Pain in unspecified hip: Secondary | ICD-10-CM | POA: Insufficient documentation

## 2013-12-26 DIAGNOSIS — R262 Difficulty in walking, not elsewhere classified: Secondary | ICD-10-CM | POA: Insufficient documentation

## 2013-12-26 DIAGNOSIS — M79659 Pain in unspecified thigh: Secondary | ICD-10-CM

## 2013-12-26 DIAGNOSIS — M25551 Pain in right hip: Secondary | ICD-10-CM

## 2014-01-02 ENCOUNTER — Encounter: Payer: Self-pay | Admitting: Gastroenterology

## 2014-01-02 ENCOUNTER — Ambulatory Visit (INDEPENDENT_AMBULATORY_CARE_PROVIDER_SITE_OTHER): Payer: BC Managed Care – PPO | Admitting: Gastroenterology

## 2014-01-02 VITALS — BP 101/63 | HR 68 | Temp 98.5°F | Ht 63.0 in | Wt 149.6 lb

## 2014-01-02 DIAGNOSIS — R1319 Other dysphagia: Secondary | ICD-10-CM

## 2014-01-02 DIAGNOSIS — D649 Anemia, unspecified: Secondary | ICD-10-CM

## 2014-01-02 LAB — CBC
HCT: 33.3 % — ABNORMAL LOW (ref 36.0–46.0)
HEMOGLOBIN: 10.7 g/dL — AB (ref 12.0–15.0)
MCH: 25.5 pg — ABNORMAL LOW (ref 26.0–34.0)
MCHC: 32.1 g/dL (ref 30.0–36.0)
MCV: 79.3 fL (ref 78.0–100.0)
Platelets: 292 10*3/uL (ref 150–400)
RBC: 4.2 MIL/uL (ref 3.87–5.11)
RDW: 15.7 % — AB (ref 11.5–15.5)
WBC: 3.5 10*3/uL — AB (ref 4.0–10.5)

## 2014-01-02 LAB — IRON: IRON: 36 ug/dL — AB (ref 42–145)

## 2014-01-02 NOTE — Assessment & Plan Note (Signed)
Reportedly hx of iron deficiency, likely secondary to menorrhagia . Last colonoscopy in 2010 with benign polyp. Hgb 11 last year through PCP. No concerning signs such as melena, hematochezia, abdominal pain, weight loss. She does note dysphagia with solid textures, and she will need an EGD with dilation to assess for esophageal web, ring, or stricture. Doubt malignancy.   Obtain CBC, iron, ferritin If evidence of IDA, add colonoscopy to EGD/ED.  Proceed with upper endoscopy/dilation in the near future with Dr. Gala Romney. The risks, benefits, and alternatives have been discussed in detail with patient. They have stated understanding and desire to proceed.  Review labs first, then determine if colonoscopy needed. We have not scheduled procedures today. Will review results and then contact patient with the next step.

## 2014-01-02 NOTE — Progress Notes (Signed)
cc'd to pcp 

## 2014-01-02 NOTE — Patient Instructions (Signed)
Please have blood work done today. We will call with the results.  You will definitely need an upper endoscopy with dilation; you may need a colonoscopy if you are iron deficient. We will see what the labs show!

## 2014-01-02 NOTE — Progress Notes (Signed)
    Referring Provider: Knowlton, Stephen D, MD Primary Care Physician:  KNOWLTON,STEPHEN D, MD Primary Gastroenterologist:  Dr. Rourk   Chief Complaint  Patient presents with  . Dysphagia    difficulty swallowing solids / meats/rice    HPI:   Vanessa Stokes presents today at the request of Dr. Knowlton secondary to dysphagia. Onset about 3 months ago. Notes issues with rice, grainy foods, chicken. No odynophagia. No reflux symptoms. No appetite changes, no unexplained weight loss. No N/V. No melena or hematochezia. No abdominal pain. No prior EGD.   History of anemia. Reportedly IDA. Heavy menses. Was told to take iron by PCP but hasn't been taking routinely. In Nov 2014, Hgb 11.    Last colonoscopy by Dr. Rourk in 2010 with benign polyp.   Past Medical History  Diagnosis Date  . Depression     Paxil  . Anemia   . Acromegaly     Past Surgical History  Procedure Laterality Date  . Pituitary gland tumors removed      X 2  . Colonoscopy  12/04/2008    Dr. Rourk:Friable anal canal and anal papilla, otherwise normal rectum/Left-sided diverticula polyp at the splenic flexure, status post cold snared.  The remainder of the colonic mucosa and terminal  ileum mucosa appeared normal. BENIGN polyp.     Current Outpatient Prescriptions  Medication Sig Dispense Refill  . ALPRAZolam (XANAX) 0.25 MG tablet Take 0.25 mg by mouth 2 (two) times daily as needed for anxiety.      . PARoxetine (PAXIL) 20 MG tablet Take 20 mg by mouth every morning.       No current facility-administered medications for this visit.    Allergies as of 01/02/2014  . (No Known Allergies)    Family History  Problem Relation Age of Onset  . Colon cancer Neg Hx     History   Social History  . Marital Status: Divorced    Spouse Name: N/A    Number of Children: N/A  . Years of Education: N/A   Occupational History  . [employer]    Social History Main Topics  . Smoking status: Current  Some Day Smoker  . Smokeless tobacco: Not on file     Comment: Smokes about 3 cigarettes daily  . Alcohol Use: No  . Drug Use: No  . Sexual Activity: Not on file   Other Topics Concern  . Not on file   Social History Narrative  . No narrative on file    Review of Systems: Gen: Denies any fever, chills, loss of appetite, fatigue, weight loss. CV: Denies chest pain, heart palpitations, syncope, peripheral edema. Resp: +DOE GI: see HPI GU : Denies urinary burning, urinary frequency, urinary incontinence.  MS: hip pain  Derm: Denies rash, itching, dry skin Psych: Denies depression, anxiety, confusion or memory loss  Heme: Denies bruising, bleeding, and enlarged lymph nodes.  Physical Exam: BP 101/63  Pulse 68  Temp(Src) 98.5 F (36.9 C) (Oral)  Ht 5' 3" (1.6 m)  Wt 149 lb 9.6 oz (67.858 kg)  BMI 26.51 kg/m2  LMP 12/26/2013 General:   Alert and oriented. Well-developed, well-nourished, pleasant and cooperative. Head:  Normocephalic and atraumatic. Eyes:  Conjunctiva pink, sclera clear, no icterus.   Conjunctiva pink. Ears:  Normal auditory acuity. Nose:  No deformity, discharge,  or lesions. Mouth:  No deformity or lesions, mucosa pink and moist.  Neck:  Supple, without mass or thyromegaly. Lungs:  Clear to auscultation   bilaterally, without wheezing, rales, or rhonchi.  Heart:  S1, S2 present without murmurs noted.  Abdomen:  +BS, soft, non-tender and non-distended. Without mass or HSM. No rebound or guarding. No hernias noted. Rectal:  Deferred  Msk:  Symmetrical without gross deformities. Normal posture. Pulses:  Normal pulses noted. Extremities:  Thickening of distal fingers r/t acromegaly Neurologic:  Alert and  oriented x4;  grossly normal neurologically. Skin:  Intact, warm and dry without significant lesions or rashes Psych:  Alert and cooperative. Normal mood and affect.

## 2014-01-02 NOTE — Assessment & Plan Note (Signed)
EGD/ED in near future.

## 2014-01-03 LAB — FERRITIN: FERRITIN: 2 ng/mL — AB (ref 10–291)

## 2014-01-08 NOTE — Progress Notes (Signed)
Quick Note:  Hgb 10.7. Ferritin extremely low, iron low. Evidence of IDA. With history of menorrhagia, not surprising. However, warrants adding a colonoscopy to EGD/ED.  Please set up TCS/EGD/ED with RMR.  Needs to take iron as prescribed by PCP. Will need to hold 7 days prior to procedures. ______

## 2014-01-09 ENCOUNTER — Other Ambulatory Visit: Payer: Self-pay | Admitting: Internal Medicine

## 2014-01-09 ENCOUNTER — Encounter (HOSPITAL_COMMUNITY): Payer: Self-pay | Admitting: Pharmacy Technician

## 2014-01-09 DIAGNOSIS — D509 Iron deficiency anemia, unspecified: Secondary | ICD-10-CM

## 2014-01-09 MED ORDER — PEG 3350-KCL-NA BICARB-NACL 420 G PO SOLR: 4000.0000 mL | ORAL | Status: DC

## 2014-01-28 ENCOUNTER — Encounter (HOSPITAL_COMMUNITY): Payer: Self-pay | Admitting: *Deleted

## 2014-01-28 ENCOUNTER — Ambulatory Visit (HOSPITAL_COMMUNITY)
Admission: RE | Admit: 2014-01-28 | Discharge: 2014-01-28 | Disposition: A | Discharge status: Home / Self Care | Payer: BC Managed Care – PPO | Source: Ambulatory Visit | Attending: Internal Medicine | Admitting: Internal Medicine

## 2014-01-28 ENCOUNTER — Encounter (HOSPITAL_COMMUNITY)
Admission: RE | Disposition: A | Discharge status: Home / Self Care | Payer: Self-pay | Source: Ambulatory Visit | Attending: Internal Medicine

## 2014-01-28 DIAGNOSIS — D509 Iron deficiency anemia, unspecified: Secondary | ICD-10-CM | POA: Insufficient documentation

## 2014-01-28 DIAGNOSIS — K2270 Barrett's esophagus without dysplasia: Secondary | ICD-10-CM | POA: Insufficient documentation

## 2014-01-28 DIAGNOSIS — K449 Diaphragmatic hernia without obstruction or gangrene: Secondary | ICD-10-CM | POA: Insufficient documentation

## 2014-01-28 DIAGNOSIS — K2100 Gastro-esophageal reflux disease with esophagitis, without bleeding: Secondary | ICD-10-CM | POA: Insufficient documentation

## 2014-01-28 DIAGNOSIS — R1310 Dysphagia, unspecified: Secondary | ICD-10-CM | POA: Insufficient documentation

## 2014-01-28 DIAGNOSIS — K259 Gastric ulcer, unspecified as acute or chronic, without hemorrhage or perforation: Secondary | ICD-10-CM | POA: Insufficient documentation

## 2014-01-28 DIAGNOSIS — F3289 Other specified depressive episodes: Secondary | ICD-10-CM | POA: Insufficient documentation

## 2014-01-28 DIAGNOSIS — K2960 Other gastritis without bleeding: Secondary | ICD-10-CM | POA: Insufficient documentation

## 2014-01-28 DIAGNOSIS — F17200 Nicotine dependence, unspecified, uncomplicated: Secondary | ICD-10-CM | POA: Insufficient documentation

## 2014-01-28 DIAGNOSIS — F329 Major depressive disorder, single episode, unspecified: Secondary | ICD-10-CM | POA: Insufficient documentation

## 2014-01-28 DIAGNOSIS — Z79899 Other long term (current) drug therapy: Secondary | ICD-10-CM | POA: Insufficient documentation

## 2014-01-28 HISTORY — PX: COLONOSCOPY, ESOPHAGOGASTRODUODENOSCOPY (EGD) AND ESOPHAGEAL DILATION: SHX5781

## 2014-01-28 SURGERY — COLONOSCOPY, ESOPHAGOGASTRODUODENOSCOPY (EGD) AND ESOPHAGEAL DILATION (ED)
Anesthesia: Moderate Sedation

## 2014-01-28 MED ORDER — ONDANSETRON HCL 4 MG/2ML IJ SOLN
INTRAMUSCULAR | Status: AC
  Filled 2014-01-28: qty 2

## 2014-01-28 MED ORDER — MIDAZOLAM HCL 5 MG/5ML IJ SOLN
INTRAMUSCULAR | Status: DC | PRN
  Administered 2014-01-28: 2 mg via INTRAVENOUS
  Administered 2014-01-28: 1 mg via INTRAVENOUS
  Administered 2014-01-28: 2 mg via INTRAVENOUS
  Administered 2014-01-28 (×2): 1 mg via INTRAVENOUS

## 2014-01-28 MED ORDER — MIDAZOLAM HCL 5 MG/5ML IJ SOLN
INTRAMUSCULAR | Status: AC
  Filled 2014-01-28: qty 10

## 2014-01-28 MED ORDER — ONDANSETRON HCL 4 MG/2ML IJ SOLN
INTRAMUSCULAR | Status: DC | PRN
  Administered 2014-01-28: 4 mg via INTRAVENOUS

## 2014-01-28 MED ORDER — LIDOCAINE VISCOUS 2 % MT SOLN
OROMUCOSAL | Status: DC | PRN
  Administered 2014-01-28: 3 mL via OROMUCOSAL

## 2014-01-28 MED ORDER — MEPERIDINE HCL 100 MG/ML IJ SOLN
INTRAMUSCULAR | Status: AC
  Filled 2014-01-28: qty 2

## 2014-01-28 MED ORDER — STERILE WATER FOR IRRIGATION IR SOLN
Status: DC | PRN
  Administered 2014-01-28: 09:00:00

## 2014-01-28 MED ORDER — MEPERIDINE HCL 100 MG/ML IJ SOLN
INTRAMUSCULAR | Status: DC | PRN
  Administered 2014-01-28 (×2): 25 mg via INTRAVENOUS
  Administered 2014-01-28: 50 mg via INTRAVENOUS

## 2014-01-28 MED ORDER — LIDOCAINE VISCOUS 2 % MT SOLN
OROMUCOSAL | Status: AC
  Filled 2014-01-28: qty 15

## 2014-01-28 MED ORDER — MEPERIDINE HCL 100 MG/ML IJ SOLN
INTRAMUSCULAR | Status: DC
Start: 2014-01-28 — End: 2014-01-28
  Filled 2014-01-28: qty 2

## 2014-01-28 MED ORDER — SODIUM CHLORIDE 0.9 % IV SOLN
INTRAVENOUS | Status: DC
  Administered 2014-01-28: 08:00:00 via INTRAVENOUS

## 2014-01-28 NOTE — Interval H&P Note (Signed)
History and Physical Interval Note:  01/28/2014 8:37 AM  Yisroel Ramming  has presented today for surgery, with the diagnosis of IRON DEFICIENCY ANEMIA  The various methods of treatment have been discussed with the patient and family. After consideration of risks, benefits and other options for treatment, the patient has consented to  Procedure(s) with comments: COLONOSCOPY, ESOPHAGOGASTRODUODENOSCOPY (EGD) AND ESOPHAGEAL DILATION (ED) (N/A) - 830 as a surgical intervention .  The patient's history has been reviewed, patient examined, no change in status, stable for surgery.  I have reviewed the patient's chart and labs.  Questions were answered to the patient's satisfaction.    Patient is iron deficient. EGD with esophageal dilation as appropriate and colonoscopy. The risks, benefits, limitations, imponderables and alternatives regarding both EGD and colonoscopy have been reviewed with the patient. Questions have been answered. All parties agreeable.   Manus Rudd

## 2014-01-28 NOTE — Discharge Instructions (Addendum)
Colonoscopy Discharge Instructions  Read the instructions outlined below and refer to this sheet in the next few weeks. These discharge instructions provide you with general information on caring for yourself after you leave the hospital. Your doctor may also give you specific instructions. While your treatment has been planned according to the most current medical practices available, unavoidable complications occasionally occur. If you have any problems or questions after discharge, call Dr. Gala Romney at [phone]. ACTIVITY  You may resume your regular activity, but move at a slower pace for the next 24 hours.   Take frequent rest periods for the next 24 hours.   Walking will help get rid of the air and reduce the bloated feeling in your belly (abdomen).   No driving for 24 hours (because of the medicine (anesthesia) used during the test).    Do not sign any important legal documents or operate any machinery for 24 hours (because of the anesthesia used during the test).  NUTRITION  Drink plenty of fluids.   You may resume your normal diet as instructed by your doctor.   Begin with a light meal and progress to your normal diet. Heavy or fried foods are harder to digest and may make you feel sick to your stomach (nauseated).   Avoid alcoholic beverages for 24 hours or as instructed.  MEDICATIONS  You may resume your normal medications unless your doctor tells you otherwise.  WHAT YOU CAN EXPECT TODAY  Some feelings of bloating in the abdomen.   Passage of more gas than usual.   Spotting of blood in your stool or on the toilet paper.  IF YOU HAD POLYPS REMOVED DURING THE COLONOSCOPY:  No aspirin products for 7 days or as instructed.   No alcohol for 7 days or as instructed.   Eat a soft diet for the next 24 hours.  FINDING OUT THE RESULTS OF YOUR TEST Not all test results are available during your visit. If your test results are not back during the visit, make an appointment  with your caregiver to find out the results. Do not assume everything is normal if you have not heard from your caregiver or the medical facility. It is important for you to follow up on all of your test results.  SEEK IMMEDIATE MEDICAL ATTENTION IF:  You have more than a spotting of blood in your stool.   Your belly is swollen (abdominal distention).   You are nauseated or vomiting.   You have a temperature over 101.  You have abdominal pain or discomfort that is severe or gets worse throughout the day. EGD Discharge instructions Please read the instructions outlined below and refer to this sheet in the next few weeks. These discharge instructions provide you with general information on caring for yourself after you leave the hospital. Your doctor may also give you specific instructions. While your treatment has been planned according to the most current medical practices available, unavoidable complications occasionally occur. If you have any problems or questions after discharge, please call your doctor. ACTIVITY You may resume your regular activity but move at a slower pace for the next 24 hours.  Take frequent rest periods for the next 24 hours.  Walking will help expel (get rid of) the air and reduce the bloated feeling in your abdomen.  No driving for 24 hours (because of the anesthesia (medicine) used during the test).  You may shower.  Do not sign any important legal documents or operate any machinery for 24  hours (because of the anesthesia used during the test).  NUTRITION Drink plenty of fluids.  You may resume your normal diet.  Begin with a light meal and progress to your normal diet.  Avoid alcoholic beverages for 24 hours or as instructed by your caregiver.  MEDICATIONS You may resume your normal medications unless your caregiver tells you otherwise.  WHAT YOU CAN EXPECT TODAY You may experience abdominal discomfort such as a feeling of fullness or gas pains.   FOLLOW-UP Your doctor will discuss the results of your test with you.  SEEK IMMEDIATE MEDICAL ATTENTION IF ANY OF THE FOLLOWING OCCUR: Excessive nausea (feeling sick to your stomach) and/or vomiting.  Severe abdominal pain and distention (swelling).  Trouble swallowing.  Temperature over 101 F (37.8 C).  Rectal bleeding or vomiting of blood.    GERD information provided  Begin Protonix 40 mg daily  Further recommendations to follow pending review of pathology report  Gastroesophageal Reflux Disease, Adult Gastroesophageal reflux disease (GERD) happens when acid from your stomach flows up into the esophagus. When acid comes in contact with the esophagus, the acid causes soreness (inflammation) in the esophagus. Over time, GERD may create small holes (ulcers) in the lining of the esophagus. CAUSES   Increased body weight. This puts pressure on the stomach, making acid rise from the stomach into the esophagus.  Smoking. This increases acid production in the stomach.  Drinking alcohol. This causes decreased pressure in the lower esophageal sphincter (valve or ring of muscle between the esophagus and stomach), allowing acid from the stomach into the esophagus.  Late evening meals and a full stomach. This increases pressure and acid production in the stomach.  A malformed lower esophageal sphincter. Sometimes, no cause is found. SYMPTOMS   Burning pain in the lower part of the mid-chest behind the breastbone and in the mid-stomach area. This may occur twice a week or more often.  Trouble swallowing.  Sore throat.  Dry cough.  Asthma-like symptoms including chest tightness, shortness of breath, or wheezing. DIAGNOSIS  Your caregiver may be able to diagnose GERD based on your symptoms. In some cases, X-rays and other tests may be done to check for complications or to check the condition of your stomach and esophagus. TREATMENT  Your caregiver may recommend over-the-counter or  prescription medicines to help decrease acid production. Ask your caregiver before starting or adding any new medicines.  HOME CARE INSTRUCTIONS   Change the factors that you can control. Ask your caregiver for guidance concerning weight loss, quitting smoking, and alcohol consumption.  Avoid foods and drinks that make your symptoms worse, such as:  Caffeine or alcoholic drinks.  Chocolate.  Peppermint or mint flavorings.  Garlic and onions.  Spicy foods.  Citrus fruits, such as oranges, lemons, or limes.  Tomato-based foods such as sauce, chili, salsa, and pizza.  Fried and fatty foods.  Avoid lying down for the 3 hours prior to your bedtime or prior to taking a nap.  Eat small, frequent meals instead of large meals.  Wear loose-fitting clothing. Do not wear anything tight around your waist that causes pressure on your stomach.  Raise the head of your bed 6 to 8 inches with wood blocks to help you sleep. Extra pillows will not help.  Only take over-the-counter or prescription medicines for pain, discomfort, or fever as directed by your caregiver.  Do not take aspirin, ibuprofen, or other nonsteroidal anti-inflammatory drugs (NSAIDs). SEEK IMMEDIATE MEDICAL CARE IF:   You have pain  in your arms, neck, jaw, teeth, or back.  Your pain increases or changes in intensity or duration.  You develop nausea, vomiting, or sweating (diaphoresis).  You develop shortness of breath, or you faint.  Your vomit is green, yellow, black, or looks like coffee grounds or blood.  Your stool is red, bloody, or black. These symptoms could be signs of other problems, such as heart disease, gastric bleeding, or esophageal bleeding. MAKE SURE YOU:   Understand these instructions.  Will watch your condition.  Will get help right away if you are not doing well or get worse.

## 2014-01-28 NOTE — Op Note (Signed)
[facility] [address] [place], [address]   ENDOSCOPY PROCEDURE REPORT  PATIENT: Vanessa Stokes, Vanessa Stokes  MR#: [id number] BIRTHDATE: 10/14/1961 , 52  yrs. old GENDER: Female ENDOSCOPIST: R.  Garfield Cornea, MD FACP FACG REFERRED BY:  Lemmie Evens, M.D. PROCEDURE DATE:  01/28/2014 PROCEDURE:     EGD with Venia Minks dilation, esophageal, and gastric and duodenal bowel  INDICATIONS:      Iron deficiency anemia; dysphagia  INFORMED CONSENT:   The risks, benefits, limitations, alternatives and imponderables have been discussed.  The potential for biopsy, esophogeal dilation, etc. have also been reviewed.  Questions have been answered.  All parties agreeable.  Please see the history and physical in the medical record for more information.  MEDICATIONS: Versed 5 mg IV and Demerol 75 mg IV in divided doses. Xylocaine gel orally. Zofran 4 mg IV  DESCRIPTION OF PROCEDURE:   The [id number] ([id number])  endoscope was introduced through the mouth and advanced to the second portion of the duodenum without difficulty or limitations.  The mucosal surfaces were surveyed very carefully during advancement of the scope and upon withdrawal.  Retroflexion view of the proximal stomach and esophagogastric junction was performed.      FINDINGS:  "tongue" of salmon-colored epithelium coming up to 5 cm above the GE junction. Schatzki's ring present. 4 quadreant  distal esophageal erosions also present. Stomach empty. Small hiatal hernia. Scattered gastric erosions. Patent pylorus. Normal-appearing first, second and third portion of the duodenum.  THERAPEUTIC / DIAGNOSTIC MANEUVERS PERFORMED:  A 54 French Maloney dilator passed to full insertion easily. A look back revealed ring persisted. Subsequently, biopsies of the salmon-colored epithelium taken.; Biopsies of the abnormal gastric mucosa taken and finally biopsies of the second and third portion of the duodenum taken.  I went back  with the biopsy forceps and disrupted the ring with 4 quadrant  "bites". This was done effectively and without apparent complication.   COMPLICATIONS:  None  IMPRESSION:    Erosive reflux esophagitis. Query short segment Barrett's status - post biopsy after Maloney dilation. Status post [facility] dilation. Hiatal hernia. Gastric erosions-status post biopsy. Status post duodenal biopsy  RECOMMENDATIONS:  Followup on pathology.  See Colonoscopy    _______________________________ R. Garfield Cornea, MD FACP [facility] eSigned:  R. Garfield Cornea, MD FACP [facility] 01/28/2014 9:14 AM     CC:  PATIENT NAME:  Vanessa Stokes, Vanessa Stokes MR#: [id number]

## 2014-01-28 NOTE — H&P (View-Only) (Signed)
Referring Provider: Robert Bellow, MD Primary Care Physician:  Robert Bellow, MD Primary Gastroenterologist:  Dr. Gala Romney   Chief Complaint  Patient presents with  . Dysphagia    difficulty swallowing solids / meats/rice    HPI:   Vanessa Stokes presents today at the request of Dr. Karie Kirks secondary to dysphagia. Onset about 3 months ago. Notes issues with rice, grainy foods, chicken. No odynophagia. No reflux symptoms. No appetite changes, no unexplained weight loss. No N/V. No melena or hematochezia. No abdominal pain. No prior EGD.   History of anemia. Reportedly IDA. Heavy menses. Was told to take iron by PCP but hasn't been taking routinely. In Nov 2014, Hgb 11.    Last colonoscopy by Dr. Gala Romney in 2010 with benign polyp.   Past Medical History  Diagnosis Date  . Depression     Paxil  . Anemia   . Acromegaly     Past Surgical History  Procedure Laterality Date  . Pituitary gland tumors removed      X 2  . Colonoscopy  12/04/2008    Dr. Rourk:Friable anal canal and anal papilla, otherwise normal rectum/Left-sided diverticula polyp at the splenic flexure, status post cold snared.  The remainder of the colonic mucosa and terminal  ileum mucosa appeared normal. BENIGN polyp.     Current Outpatient Prescriptions  Medication Sig Dispense Refill  . ALPRAZolam (XANAX) 0.25 MG tablet Take 0.25 mg by mouth 2 (two) times daily as needed for anxiety.      Marland Kitchen PARoxetine (PAXIL) 20 MG tablet Take 20 mg by mouth every morning.       No current facility-administered medications for this visit.    Allergies as of 01/02/2014  . (No Known Allergies)    Family History  Problem Relation Age of Onset  . Colon cancer Neg Hx     History   Social History  . Marital Status: Divorced    Spouse Name: N/A    Number of Children: N/A  . Years of Education: N/A   Occupational History  . [employer]    Social History Main Topics  . Smoking status: Current  Some Day Smoker  . Smokeless tobacco: Not on file     Comment: Smokes about 3 cigarettes daily  . Alcohol Use: No  . Drug Use: No  . Sexual Activity: Not on file   Other Topics Concern  . Not on file   Social History Narrative  . No narrative on file    Review of Systems: Gen: Denies any fever, chills, loss of appetite, fatigue, weight loss. CV: Denies chest pain, heart palpitations, syncope, peripheral edema. Resp: +DOE GI: see HPI GU : Denies urinary burning, urinary frequency, urinary incontinence.  MS: hip pain  Derm: Denies rash, itching, dry skin Psych: Denies depression, anxiety, confusion or memory loss  Heme: Denies bruising, bleeding, and enlarged lymph nodes.  Physical Exam: BP 101/63  Pulse 68  Temp(Src) 98.5 F (36.9 C) (Oral)  Ht 5\' 3"  (1.6 m)  Wt 149 lb 9.6 oz (67.858 kg)  BMI 26.51 kg/m2  LMP 12/26/2013 General:   Alert and oriented. Well-developed, well-nourished, pleasant and cooperative. Head:  Normocephalic and atraumatic. Eyes:  Conjunctiva pink, sclera clear, no icterus.   Conjunctiva pink. Ears:  Normal auditory acuity. Nose:  No deformity, discharge,  or lesions. Mouth:  No deformity or lesions, mucosa pink and moist.  Neck:  Supple, without mass or thyromegaly. Lungs:  Clear to auscultation  bilaterally, without wheezing, rales, or rhonchi.  Heart:  S1, S2 present without murmurs noted.  Abdomen:  +BS, soft, non-tender and non-distended. Without mass or HSM. No rebound or guarding. No hernias noted. Rectal:  Deferred  Msk:  Symmetrical without gross deformities. Normal posture. Pulses:  Normal pulses noted. Extremities:  Thickening of distal fingers r/t acromegaly Neurologic:  Alert and  oriented x4;  grossly normal neurologically. Skin:  Intact, warm and dry without significant lesions or rashes Psych:  Alert and cooperative. Normal mood and affect.

## 2014-01-28 NOTE — Op Note (Signed)
[facility] [address] [place], [address]   COLONOSCOPY PROCEDURE REPORT  PATIENT: Vanessa Stokes, Vanessa Stokes  MR#:         [id number] BIRTHDATE: 03/19/1961 , 52  yrs. old GENDER: Female ENDOSCOPIST: R.  Garfield Cornea, MD FACP FACG REFERRED BY:  Lemmie Evens, M.D. PROCEDURE DATE:  01/28/2014 PROCEDURE:     Diagnostic ileocolonoscopy  INDICATIONS: iron deficiency anemia  INFORMED CONSENT:  The risks, benefits, alternatives and imponderables including but not limited to bleeding, perforation as well as the possibility of a missed lesion have been reviewed.  The potential for biopsy, lesion removal, etc. have also been discussed.  Questions have been answered.  All parties agreeable. Please see the history and physical in the medical record for more information.  MEDICATIONS: Versed 7 mg IV and Demerol 100 mg IV in divided dose. Zofran 4 mg IV.  DESCRIPTION OF PROCEDURE:  After a digital rectal exam was performed, the EG-[id number] ([id number]) and EC-[id number] ([id number]) colonoscope was advanced from the anus through the rectum and colon to the area of the cecum, ileocecal valve and appendiceal orifice. The cecum was deeply intubated.  These structures were well-seen and photographed for the record.  From the level of the cecum and ileocecal valve, the scope was slowly and cautiously withdrawn. The mucosal surfaces were carefully surveyed utilizing scope tip deflection to facilitate fold flattening as needed.  The scope was pulled down into the rectum where a thorough examination including retroflexion was performed.    FINDINGS:  Adequate preparation. Normal rectum.  Normal Colonic mucosa. Normal distal 10 cm of terminal ileal mucosa  THERAPEUTIC / DIAGNOSTIC MANEUVERS PERFORMED:  None  COMPLICATIONS: none  CECAL WITHDRAWAL TIME:  8 minutes  IMPRESSION:  Normal ileocolonoscopy  RECOMMENDATIONS: Repeat colonoscopy for screening purposes in 10 years. See EGD report.  Begin Protonix 40 mg daily   _______________________________ eSigned:  R. Garfield Cornea, MD FACP [facility] 01/28/2014 9:35 AM   CC:

## 2014-01-30 ENCOUNTER — Encounter: Payer: Self-pay | Admitting: Internal Medicine

## 2014-01-31 ENCOUNTER — Encounter (HOSPITAL_COMMUNITY): Payer: Self-pay | Admitting: Internal Medicine

## 2014-05-15 ENCOUNTER — Encounter: Payer: Self-pay | Admitting: Internal Medicine

## 2014-07-12 ENCOUNTER — Encounter (HOSPITAL_COMMUNITY): Payer: Self-pay | Admitting: Emergency Medicine

## 2014-07-12 ENCOUNTER — Emergency Department (HOSPITAL_COMMUNITY)
Admission: EM | Admit: 2014-07-12 | Discharge: 2014-07-12 | Disposition: A | Discharge status: Home / Self Care | Payer: BC Managed Care – PPO | Attending: Emergency Medicine | Admitting: Emergency Medicine

## 2014-07-12 DIAGNOSIS — Y9289 Other specified places as the place of occurrence of the external cause: Secondary | ICD-10-CM | POA: Diagnosis not present

## 2014-07-12 DIAGNOSIS — F3289 Other specified depressive episodes: Secondary | ICD-10-CM | POA: Insufficient documentation

## 2014-07-12 DIAGNOSIS — Z9889 Other specified postprocedural states: Secondary | ICD-10-CM | POA: Diagnosis not present

## 2014-07-12 DIAGNOSIS — Z791 Long term (current) use of non-steroidal anti-inflammatories (NSAID): Secondary | ICD-10-CM | POA: Insufficient documentation

## 2014-07-12 DIAGNOSIS — Z8639 Personal history of other endocrine, nutritional and metabolic disease: Secondary | ICD-10-CM | POA: Insufficient documentation

## 2014-07-12 DIAGNOSIS — F329 Major depressive disorder, single episode, unspecified: Secondary | ICD-10-CM | POA: Diagnosis not present

## 2014-07-12 DIAGNOSIS — S0990XA Unspecified injury of head, initial encounter: Secondary | ICD-10-CM | POA: Insufficient documentation

## 2014-07-12 DIAGNOSIS — S199XXA Unspecified injury of neck, initial encounter: Secondary | ICD-10-CM

## 2014-07-12 DIAGNOSIS — S0993XA Unspecified injury of face, initial encounter: Secondary | ICD-10-CM | POA: Diagnosis not present

## 2014-07-12 DIAGNOSIS — W208XXA Other cause of strike by thrown, projected or falling object, initial encounter: Secondary | ICD-10-CM | POA: Diagnosis not present

## 2014-07-12 DIAGNOSIS — F17200 Nicotine dependence, unspecified, uncomplicated: Secondary | ICD-10-CM | POA: Diagnosis not present

## 2014-07-12 DIAGNOSIS — Z862 Personal history of diseases of the blood and blood-forming organs and certain disorders involving the immune mechanism: Secondary | ICD-10-CM | POA: Insufficient documentation

## 2014-07-12 DIAGNOSIS — Y93K1 Activity, walking an animal: Secondary | ICD-10-CM | POA: Diagnosis not present

## 2014-07-12 DIAGNOSIS — S060X1A Concussion with loss of consciousness of 30 minutes or less, initial encounter: Secondary | ICD-10-CM | POA: Insufficient documentation

## 2014-07-12 NOTE — Discharge Instructions (Signed)

## 2014-07-12 NOTE — ED Notes (Signed)
Pt c/o dizziness and neck pain after being hit with falling tree. Pt states when the tree hit her it knocked her out.

## 2014-07-12 NOTE — ED Provider Notes (Signed)
CSN: [id number]     Arrival date & time 07/12/14  0543 History   First MD Initiated Contact with Patient 07/12/14 0600     Chief Complaint  Patient presents with  . Loss of Consciousness      Patient is a 53 y.o. female presenting with head injury. The history is provided by the patient.  Head Injury Location:  Occipital Time since incident:  24 hours Mechanism of injury: direct blow   Pain details:    Quality:  Aching   Severity:  Mild   Timing:  Constant   Progression:  Unchanged Chronicity:  New Relieved by:  None tried Worsened by:  Nothing tried Associated symptoms: loss of consciousness and neck pain   Associated symptoms: no blurred vision, no focal weakness and no vomiting   Pt reports while walking her dog yesterday morning, her dog got tangled around a dead tree that fell and struck her in the head.  The tree did not strike her spine.  She reports brief LOC.  She denies any other injury.  She reports after the incident she got up and went to work and worked for 12 hours.  She came home and went to bed and woke up this morning feeling dizzy with right sided neck pain and only mild head discomfort No focal weakness No vomiting No cp/sob.   She does not take any anticoagulants  She has h/o removal of pituitary tumor  Past Medical History  Diagnosis Date  . Depression     Paxil  . Anemia   . Acromegaly    Past Surgical History  Procedure Laterality Date  . Pituitary gland tumors removed      X 2  . Colonoscopy  12/04/2008    Dr. Rourk:Friable anal canal and anal papilla, otherwise normal rectum/Left-sided diverticula polyp at the splenic flexure, status post cold snared.  The remainder of the colonic mucosa and terminal  ileum mucosa appeared normal. BENIGN polyp.   . Colonoscopy, esophagogastroduodenoscopy (egd) and esophageal dilation N/A 01/28/2014    Procedure: COLONOSCOPY, ESOPHAGOGASTRODUODENOSCOPY (EGD) AND ESOPHAGEAL DILATION (ED);  Surgeon: Daneil Dolin,  MD;  Location: [facility];  Service: Endoscopy;  Laterality: N/A;  830  . Brain surgery     Family History  Problem Relation Age of Onset  . Colon cancer Neg Hx    History  Substance Use Topics  . Smoking status: Current Some Day Smoker  . Smokeless tobacco: Not on file     Comment: Smokes about 3 cigarettes daily  . Alcohol Use: Yes   OB History   Grav Para Term Preterm Abortions TAB SAB Ect Mult Living                 Review of Systems  Eyes: Negative for blurred vision.  Cardiovascular: Negative for chest pain.  Gastrointestinal: Negative for vomiting.  Musculoskeletal: Positive for neck pain. Negative for back pain.  Neurological: Positive for dizziness and loss of consciousness. Negative for focal weakness and weakness.      Allergies  Review of patient's allergies indicates no known allergies.  Home Medications   Prior to Admission medications   Medication Sig Start Date End Date Taking? Authorizing Provider  ALPRAZolam (XANAX) 0.25 MG tablet Take 0.25 mg by mouth 2 (two) times daily as needed for anxiety.   Yes Historical Provider, MD  ibuprofen (ADVIL,MOTRIN) 200 MG tablet Take 200 mg by mouth every 6 (six) hours as needed.   Yes Historical Provider, MD  PARoxetine (PAXIL) 20 MG tablet Take 20 mg by mouth every morning.   Yes Historical Provider, MD  polyethylene glycol-electrolytes (TRILYTE) 420 G solution Take 4,000 mLs by mouth as directed. 01/09/14  Yes Daneil Dolin, MD   BP 121/79  Pulse 68  Temp(Src) 98 F (36.7 C) (Oral)  Resp 17  Ht 5' 3.5" (1.613 m)  Wt 148 lb (67.132 kg)  BMI 25.80 kg/m2  SpO2 100%  LMP 06/17/2014 Physical Exam CONSTITUTIONAL: Well developed/well nourished HEAD: Normocephalic/atraumatic. No bruising noted to scalp.  No stepoffs of hematoma noted EYES: EOMI/PERRL ENMT: Mucous membranes moist. No blood noted at either ear NECK: supple no meningeal signs SPINE:entire spine nontender, NEXUS criteria met No  bruising/crepitance/stepoffs noted to spine Cervical paraspinal tenderness CV: S1/S2 noted, no murmurs/rubs/gallops noted LUNGS: Lungs are clear to auscultation bilaterally, no apparent distress ABDOMEN: soft, nontender, no rebound or guarding GU:no cva tenderness NEURO: Pt is awake/alert, moves all extremitiesx4, GCS 15.  Gait normal without ataxia EXTREMITIES: pulses normal, full ROM SKIN: warm, color normal PSYCH: no abnormalities of mood noted  ED Course  Procedures   Suspect pt has mild concussion She is well appearing, no distress, smiling I offered CT head as I can not completely rule out traumatic brain injury.  After further discussion, she prefers to defer any CT imaging and she wants to go home.  She and her family understand that I can not completely r/o brain injury without CT head.  She will return if any new headache, vomiting or weakness over the next 12-24 hours MDM   Final diagnoses:  Concussion, with loss of consciousness of 30 minutes or less, initial encounter    Nursing notes including past medical history and social history reviewed and considered in documentation     Sharyon Cable, MD 07/12/14 [phone]

## 2015-01-23 ENCOUNTER — Encounter: Payer: Self-pay | Admitting: Internal Medicine

## 2015-04-05 IMAGING — CR DG HIP COMPLETE 2+V*R*
3 series · 3 of 3 positions shown · non-contrast
Comparison: DG FEMUR*R* dated 04/16/2013;

CLINICAL DATA: Right hip pain and instability with walking for 1
year. No acute injury.

EXAM:
RIGHT HIP - COMPLETE 2+ VIEW

[view not recorded (1 of 3)]
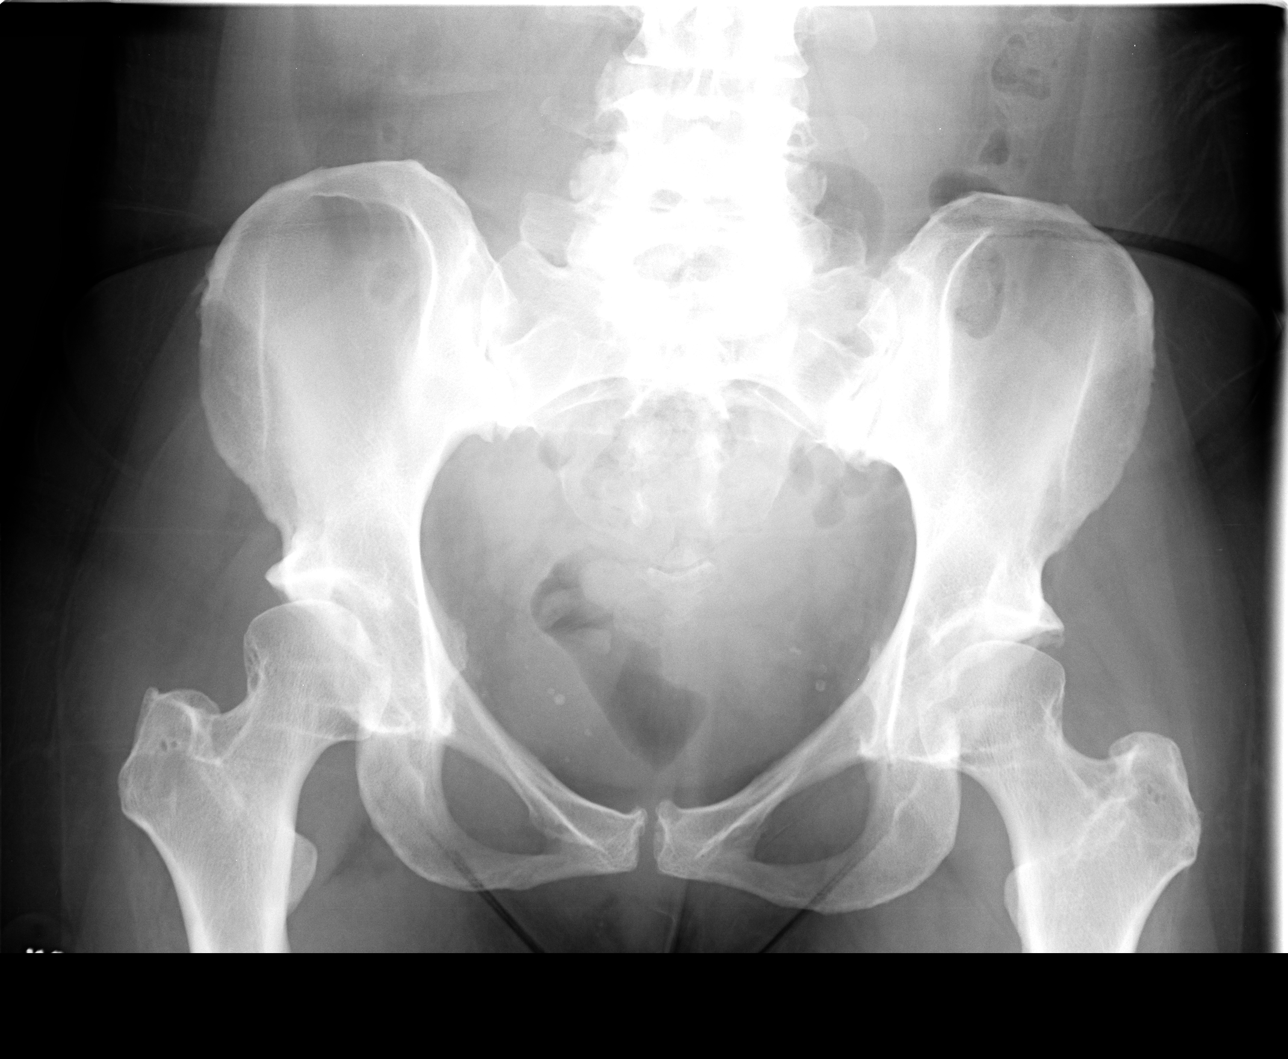

[view not recorded (2 of 3)]
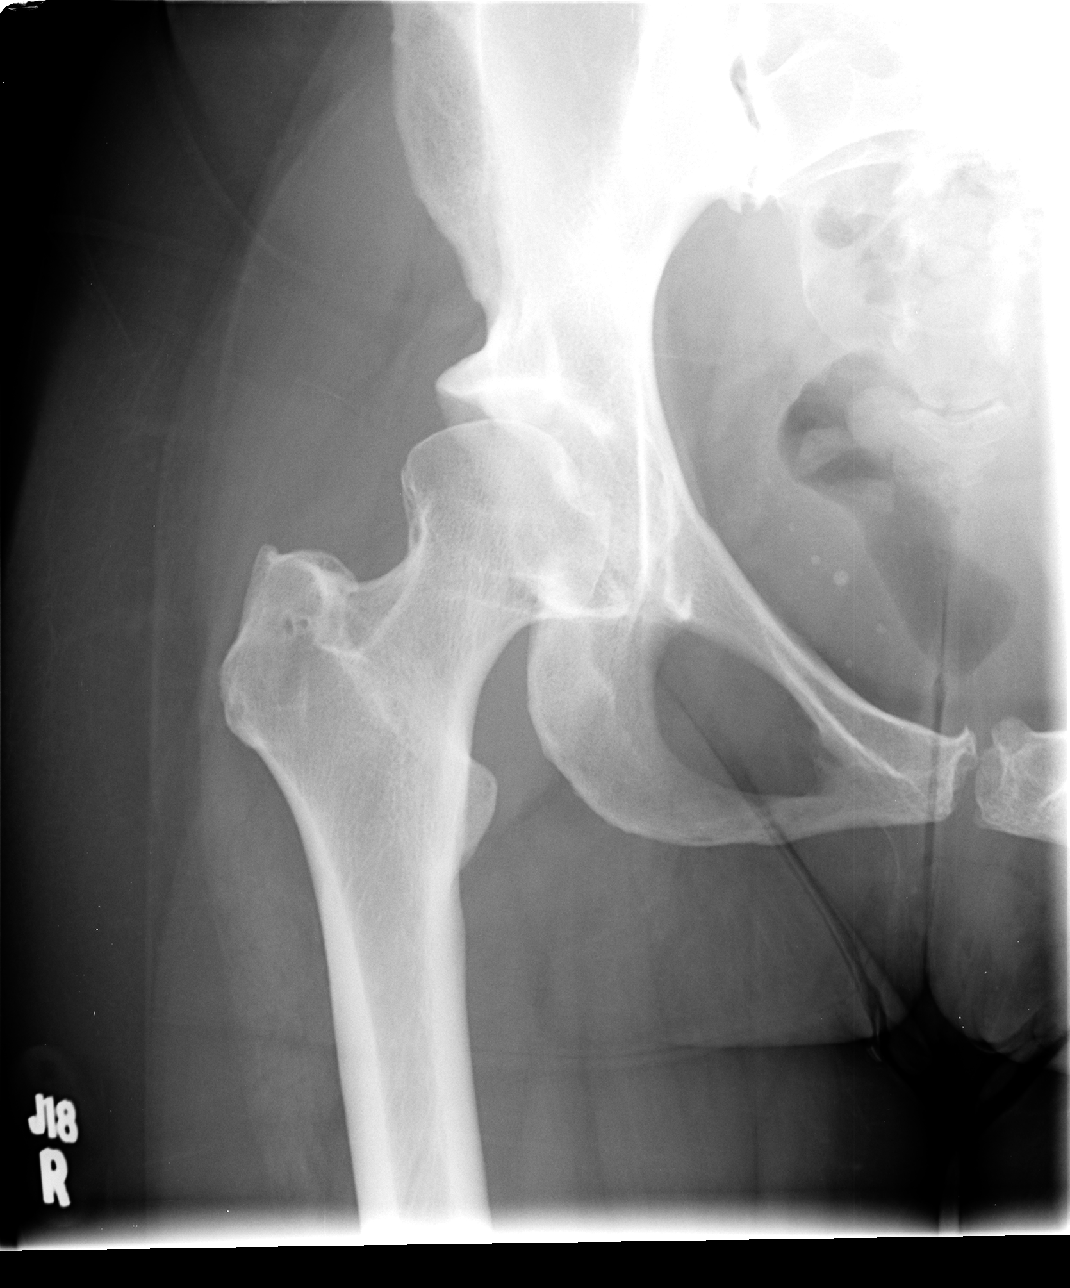

[view not recorded (3 of 3)]
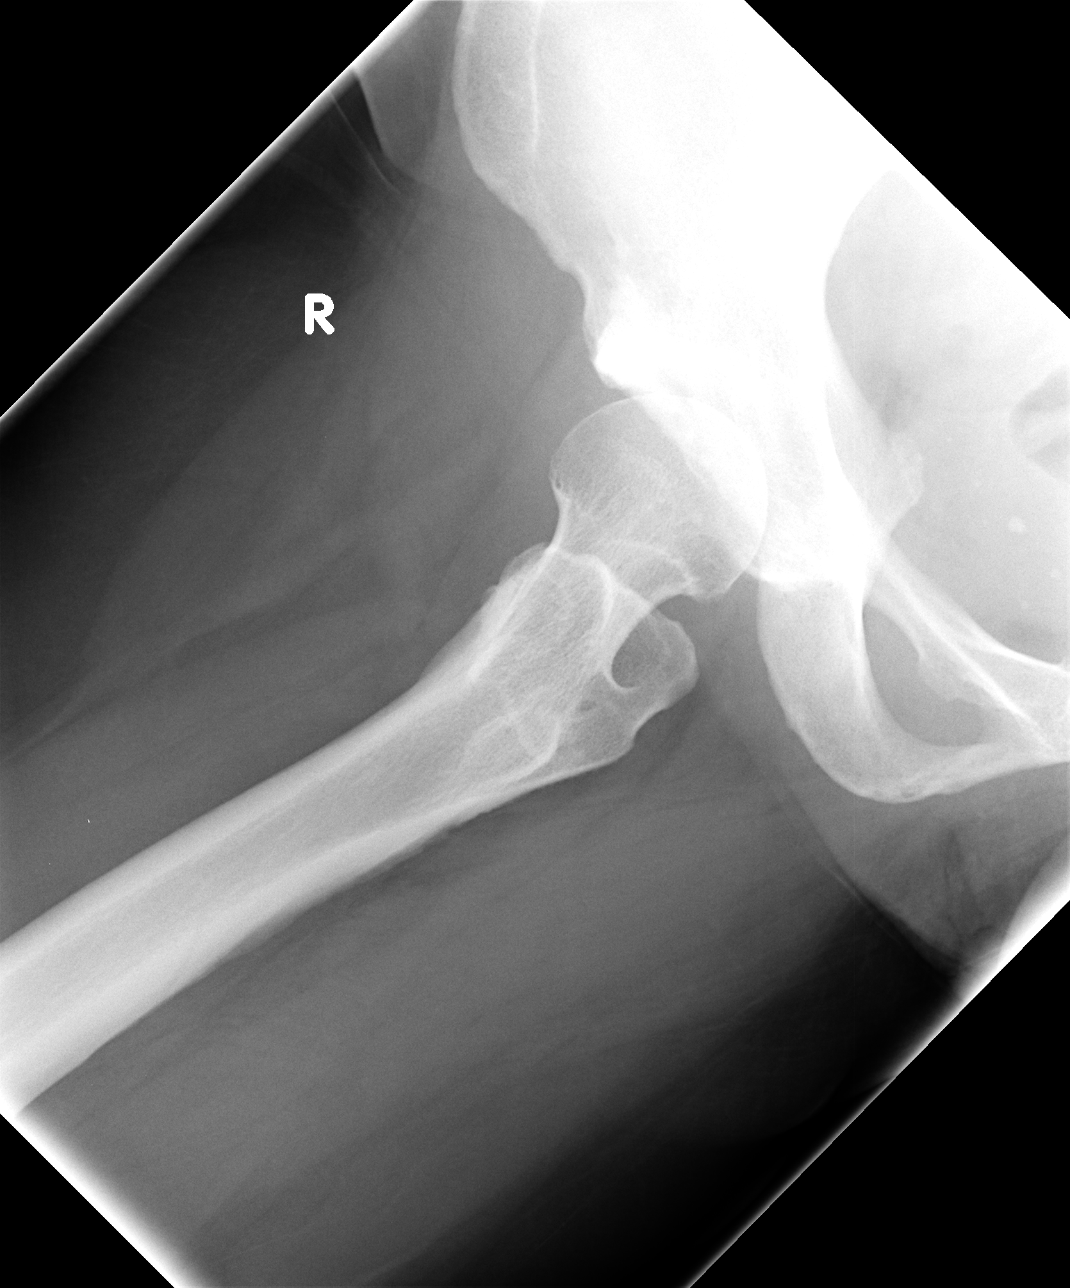

[3 of 3 positions shown; findings below may reference images not displayed]

MR EXTREM LOW*R* W/O CM
dated 12/01/2005; DG LUMBAR SPINE COMPLETE 4+V dated 02/12/2013
FINDINGS: The mineralization and alignment are normal. There is no evidence of
acute fracture or dislocation. Both femoral necks demonstrated a
mild coxa valga configuration which appears stable. There is no
evidence of femoral head avascular necrosis. The hip joint spaces
are maintained. There is transitional lumbosacral anatomy with mild
degenerative changes of the sacroiliac joints and symphysis pubis.
Bilateral pelvic calcifications are grossly stable.
IMPRESSION: No acute osseous findings.  Bilateral coxa valga.

## 2015-05-12 ENCOUNTER — Ambulatory Visit (INDEPENDENT_AMBULATORY_CARE_PROVIDER_SITE_OTHER): Payer: BLUE CROSS/BLUE SHIELD | Admitting: Adult Health

## 2015-05-12 ENCOUNTER — Encounter: Payer: Self-pay | Admitting: Adult Health

## 2015-05-12 VITALS — BP 100/78 | HR 64 | Ht 63.5 in | Wt 153.0 lb

## 2015-05-12 DIAGNOSIS — N921 Excessive and frequent menstruation with irregular cycle: Secondary | ICD-10-CM

## 2015-05-12 DIAGNOSIS — R5383 Other fatigue: Secondary | ICD-10-CM | POA: Diagnosis not present

## 2015-05-12 DIAGNOSIS — R140 Abdominal distension (gaseous): Secondary | ICD-10-CM | POA: Diagnosis not present

## 2015-05-12 DIAGNOSIS — N898 Other specified noninflammatory disorders of vagina: Secondary | ICD-10-CM | POA: Diagnosis not present

## 2015-05-12 DIAGNOSIS — N946 Dysmenorrhea, unspecified: Secondary | ICD-10-CM | POA: Diagnosis not present

## 2015-05-12 HISTORY — DX: Dysmenorrhea, unspecified: N94.6

## 2015-05-12 HISTORY — DX: Other specified noninflammatory disorders of vagina: N89.8

## 2015-05-12 HISTORY — DX: Excessive and frequent menstruation with irregular cycle: N92.1

## 2015-05-12 HISTORY — DX: Other fatigue: R53.83

## 2015-05-12 HISTORY — DX: Abdominal distension (gaseous): R14.0

## 2015-05-12 NOTE — Patient Instructions (Signed)
Fatigue Fatigue is a feeling of tiredness, lack of energy, lack of motivation, or feeling tired all the time. Having enough rest, good nutrition, and reducing stress will normally reduce fatigue. Consult your caregiver if it persists. The nature of your fatigue will help your caregiver to find out its cause. The treatment is based on the cause.  CAUSES  There are many causes for fatigue. Most of the time, fatigue can be traced to one or more of your habits or routines. Most causes fit into one or more of three general areas. They are: Lifestyle problems  Sleep disturbances.  Overwork.  Physical exertion.  Unhealthy habits.  Poor eating habits or eating disorders.  Alcohol and/or drug use .  Lack of proper nutrition (malnutrition). Psychological problems  Stress and/or anxiety problems.  Depression.  Grief.  Boredom. Medical Problems or Conditions  Anemia.  Pregnancy.  Thyroid gland problems.  Recovery from major surgery.  Continuous pain.  Emphysema or asthma that is not well controlled  Allergic conditions.  Diabetes.  Infections (such as mononucleosis).  Obesity.  Sleep disorders, such as sleep apnea.  Heart failure or other heart-related problems.  Cancer.  Kidney disease.  Liver disease.  Effects of certain medicines such as antihistamines, cough and cold remedies, prescription pain medicines, heart and blood pressure medicines, drugs used for treatment of cancer, and some antidepressants. SYMPTOMS  The symptoms of fatigue include:   Lack of energy.  Lack of drive (motivation).  Drowsiness.  Feeling of indifference to the surroundings. DIAGNOSIS  The details of how you feel help guide your caregiver in finding out what is causing the fatigue. You will be asked about your present and past health condition. It is important to review all medicines that you take, including prescription and non-prescription items. A thorough exam will be done.  You will be questioned about your feelings, habits, and normal lifestyle. Your caregiver may suggest blood tests, urine tests, or other tests to look for common medical causes of fatigue.  TREATMENT  Fatigue is treated by correcting the underlying cause. For example, if you have continuous pain or depression, treating these causes will improve how you feel. Similarly, adjusting the dose of certain medicines will help in reducing fatigue.  HOME CARE INSTRUCTIONS   Try to get the required amount of good sleep every night.  Eat a healthy and nutritious diet, and drink enough water throughout the day.  Practice ways of relaxing (including yoga or meditation).  Exercise regularly.  Make plans to change situations that cause stress. Act on those plans so that stresses decrease over time. Keep your work and personal routine reasonable.  Avoid street drugs and minimize use of alcohol.  Start taking a daily multivitamin after consulting your caregiver. SEEK MEDICAL CARE IF:   You have persistent tiredness, which cannot be accounted for.  You have fever.  You have unintentional weight loss.  You have headaches.  You have disturbed sleep throughout the night.  You are feeling sad.  You have constipation.  You have dry skin.  You have gained weight.  You are taking any new or different medicines that you suspect are causing fatigue.  You are unable to sleep at night.  You develop any unusual swelling of your legs or other parts of your body. SEEK IMMEDIATE MEDICAL CARE IF:   You are feeling confused.  Your vision is blurred.  You feel faint or pass out.  You develop severe headache.  You develop severe abdominal, pelvic, or   back pain.  You develop chest pain, shortness of breath, or an irregular or fast heartbeat.  You are unable to pass a normal amount of urine.  You develop abnormal bleeding such as bleeding from the rectum or you vomit blood.  You have thoughts  about harming yourself or committing suicide.  You are worried that you might harm someone else. MAKE SURE YOU:   Understand these instructions.  Will watch your condition.  Will get help right away if you are not doing well or get worse. Document Released: 09/05/2007 Document Revised: 01/31/2012 Document Reviewed: 03/12/2014 [facility] Patient Information 2015 [place], [place]. This information is not intended to replace advice given to you by your health care provider. Make sure you discuss any questions you have with your health care provider. Dysmenorrhea Menstrual cramps (dysmenorrhea) are caused by the muscles of the uterus tightening (contracting) during a menstrual period. For some women, this discomfort is merely bothersome. For others, dysmenorrhea can be severe enough to interfere with everyday activities for a few days each month. Primary dysmenorrhea is menstrual cramps that last a couple of days when you start having menstrual periods or soon after. This often begins after a teenager starts having her period. As a woman gets older or has a baby, the cramps will usually lessen or disappear. Secondary dysmenorrhea begins later in life, lasts longer, and the pain may be stronger than primary dysmenorrhea. The pain may start before the period and last a few days after the period.  CAUSES  Dysmenorrhea is usually caused by an underlying problem, such as:  The tissue lining the uterus grows outside of the uterus in other areas of the body (endometriosis).  The endometrial tissue, which normally lines the uterus, is found in or grows into the muscular walls of the uterus (adenomyosis).  The pelvic blood vessels are engorged with blood just before the menstrual period (pelvic congestive syndrome).  Overgrowth of cells (polyps) in the lining of the uterus or cervix.  Falling down of the uterus (prolapse) because of loose or stretched ligaments.  Depression.  Bladder problems,  infection, or inflammation.  Problems with the intestine, a tumor, or irritable bowel syndrome.  Cancer of the female organs or bladder.  A severely tipped uterus.  A very tight opening or closed cervix.  Noncancerous tumors of the uterus (fibroids).  Pelvic inflammatory disease (PID).  Pelvic scarring (adhesions) from a previous surgery.  Ovarian cyst.  An intrauterine device (IUD) used for birth control. RISK FACTORS You may be at greater risk of dysmenorrhea if:  You are younger than age 56.  You started puberty early.  You have irregular or heavy bleeding.  You have never given birth.  You have a family history of this problem.  You are a smoker. SIGNS AND SYMPTOMS   Cramping or throbbing pain in your lower abdomen.  Headaches.  Lower back pain.  Nausea or vomiting.  Diarrhea.  Sweating or dizziness.  Loose stools. DIAGNOSIS  A diagnosis is based on your history, symptoms, physical exam, diagnostic tests, or procedures. Diagnostic tests or procedures may include:  Blood tests.  Ultrasonography.  An examination of the lining of the uterus (dilation and curettage, D&C).  An examination inside your abdomen or pelvis with a scope (laparoscopy).  X-rays.  CT scan.  MRI.  An examination inside the bladder with a scope (cystoscopy).  An examination inside the intestine or stomach with a scope (colonoscopy, gastroscopy). TREATMENT  Treatment depends on the cause of the dysmenorrhea.  Treatment may include:  Pain medicine prescribed by your health care provider.  Birth control pills or an IUD with progesterone hormone in it.  Hormone replacement therapy.  Nonsteroidal anti-inflammatory drugs (NSAIDs). These may help stop the production of prostaglandins.  Surgery to remove adhesions, endometriosis, ovarian cyst, or fibroids.  Removal of the uterus (hysterectomy).  Progesterone shots to stop the menstrual period.  Cutting the nerves on the  sacrum that go to the female organs (presacral neurectomy).  Electric current to the sacral nerves (sacral nerve stimulation).  Antidepressant medicine.  Psychiatric therapy, counseling, or group therapy.  Exercise and physical therapy.  Meditation and yoga therapy.  Acupuncture. HOME CARE INSTRUCTIONS   Only take over-the-counter or prescription medicines as directed by your health care provider.  Place a heating pad or hot water bottle on your lower back or abdomen. Do not sleep with the heating pad.  Use aerobic exercises, walking, swimming, biking, and other exercises to help lessen the cramping.  Massage to the lower back or abdomen may help.  Stop smoking.  Avoid alcohol and caffeine. SEEK MEDICAL CARE IF:   Your pain does not get better with medicine.  You have pain with sexual intercourse.  Your pain increases and is not controlled with medicines.  You have abnormal vaginal bleeding with your period.  You develop nausea or vomiting with your period that is not controlled with medicine. SEEK IMMEDIATE MEDICAL CARE IF:  You pass out.  Document Released: 11/08/2005 Document Revised: 07/11/2013 Document Reviewed: 04/26/2013 [facility] Patient Information 2015 [place], [place]. This information is not intended to replace advice given to you by your health care provider. Make sure you discuss any questions you have with your health care provider. Menorrhagia Menorrhagia is the medical term for when your menstrual periods are heavy or last longer than usual. With menorrhagia, every period you have may cause enough blood loss and cramping that you are unable to maintain your usual activities. CAUSES  In some cases, the cause of heavy periods is unknown, but a number of conditions may cause menorrhagia. Common causes include:  A problem with the hormone-producing thyroid gland (hypothyroid).  Noncancerous growths in the uterus (polyps or fibroids).  An imbalance of the  estrogen and progesterone hormones.  One of your ovaries not releasing an egg during one or more months.  Side effects of having an intrauterine device (IUD).  Side effects of some medicines, such as anti-inflammatory medicines or blood thinners.  A bleeding disorder that stops your blood from clotting normally. SIGNS AND SYMPTOMS  During a normal period, bleeding lasts between 4 and 8 days. Signs that your periods are too heavy include:  You routinely have to change your pad or tampon every 1 or 2 hours because it is completely soaked.  You pass blood clots larger than 1 inch (2.5 cm) in size.  You have bleeding for more than 7 days.  You need to use pads and tampons at the same time because of heavy bleeding.  You need to wake up to change your pads or tampons during the night.  You have symptoms of anemia, such as tiredness, fatigue, or shortness of breath. DIAGNOSIS  Your health care provider will perform a physical exam and ask you questions about your symptoms and menstrual history. Other tests may be ordered based on what the health care provider finds during the exam. These tests can include:  Blood tests. Blood tests are used to check if you are pregnant or have hormonal  changes, a bleeding or thyroid disorder, low iron levels (anemia), or other problems.  Endometrial biopsy. Your health care provider takes a sample of tissue from the inside of your uterus to be examined under a microscope.  Pelvic ultrasound. This test uses sound waves to make a picture of your uterus, ovaries, and vagina. The pictures can show if you have fibroids or other growths.  Hysteroscopy. For this test, your health care provider will use a small telescope to look inside your uterus. Based on the results of your initial tests, your health care provider may recommend further testing. TREATMENT  Treatment may not be needed. If it is needed, your health care provider may recommend treatment with one  or more medicines first. If these do not reduce bleeding enough, a surgical treatment might be an option. The best treatment for you will depend on:   Whether you need to prevent pregnancy.  Your desire to have children in the future.  The cause and severity of your bleeding.  Your opinion and personal preference.  Medicines for menorrhagia may include:  Birth control methods that use hormones. These include the pill, skin patch, vaginal ring, shots that you get every 3 months, hormonal IUD, and implant. These treatments reduce bleeding during your menstrual period.  Medicines that thicken blood and slow bleeding.  Medicines that reduce swelling, such as ibuprofen.  Medicines that contain a synthetic hormone called progestin.   Medicines that make the ovaries stop working for a short time.  You may need surgical treatment for menorrhagia if the medicines are unsuccessful. Treatment options include:  Dilation and curettage (D&C). In this procedure, your health care provider opens (dilates) your cervix and then scrapes or suctions tissue from the lining of your uterus to reduce menstrual bleeding.  Operative hysteroscopy. This procedure uses a tiny tube with a light (hysteroscope) to view your uterine cavity and can help in the surgical removal of a polyp that may be causing heavy periods.  Endometrial ablation. Through various techniques, your health care provider permanently destroys the entire lining of your uterus (endometrium). After endometrial ablation, most women have little or no menstrual flow. Endometrial ablation reduces your ability to become pregnant.  Endometrial resection. This surgical procedure uses an electrosurgical wire loop to remove the lining of the uterus. This procedure also reduces your ability to become pregnant.  Hysterectomy. Surgical removal of the uterus and cervix is a permanent procedure that stops menstrual periods. Pregnancy is not possible after  a hysterectomy. This procedure requires anesthesia and hospitalization. HOME CARE INSTRUCTIONS   Only take over-the-counter or prescription medicines as directed by your health care provider. Take prescribed medicines exactly as directed. Do not change or switch medicines without consulting your health care provider.  Take any prescribed iron pills exactly as directed by your health care provider. Long-term heavy bleeding may result in low iron levels. Iron pills help replace the iron your body lost from heavy bleeding. Iron may cause constipation. If this becomes a problem, increase the bran, fruits, and roughage in your diet.  Do not take aspirin or medicines that contain aspirin 1 week before or during your menstrual period. Aspirin may make the bleeding worse.  If you need to change your sanitary pad or tampon more than once every 2 hours, stay in bed and rest as much as possible until the bleeding stops.  Eat well-balanced meals. Eat foods high in iron. Examples are leafy green vegetables, meat, liver, eggs, and whole grain breads and  cereals. Do not try to lose weight until the abnormal bleeding has stopped and your blood iron level is back to normal. SEEK MEDICAL CARE IF:   You soak through a pad or tampon every 1 or 2 hours, and this happens every time you have a period.  You need to use pads and tampons at the same time because you are bleeding so much.  You need to change your pad or tampon during the night.  You have a period that lasts for more than 8 days.  You pass clots bigger than 1 inch wide.  You have irregular periods that happen more or less often than once a month.  You feel dizzy or faint.  You feel very weak or tired.  You feel short of breath or feel your heart is beating too fast when you exercise.  You have nausea and vomiting or diarrhea while you are taking your medicine.  You have any problems that may be related to the medicine you are taking. SEEK  IMMEDIATE MEDICAL CARE IF:   You soak through 4 or more pads or tampons in 2 hours.  You have any bleeding while you are pregnant. MAKE SURE YOU:   Understand these instructions.  Will watch your condition.  Will get help right away if you are not doing well or get worse. Document Released: 11/08/2005 Document Revised: 11/13/2013 Document Reviewed: 04/29/2013 [facility] Patient Information 2015 [place], [place]. This information is not intended to replace advice given to you by your health care provider. Make sure you discuss any questions you have with your health care provider. Return 6/22 at 11:30 for [place] Then see me 6/27  For pap and physical

## 2015-05-12 NOTE — Progress Notes (Signed)
Subjective:     Patient ID: Vanessa Stokes, female   DOB: [birth year], 54 y.o.   MRN: [id number]  HPI [place] is a  54 year old white female in complaining of long periods, changes pads every 4 hours, has cramps and is bloated and has no energy. She has not had pap and physical in long time she says. Saw PCP and has low vitamin D and iron level low.No clots with periods. PCP is Dr Ulice Brilliant at [place].  Review of Systems  Patient denies any headaches, hearing loss,  blurred vision, shortness of breath, chest pain, abdominal pain, problems with bowel movements, urination, or intercourse. No joint pain or mood swings.See HPI for positives.   Reviewed past medical,surgical, social and family history. Reviewed medications and allergies.     Objective:   Physical Exam BP 100/78 mmHg  Pulse 64  Ht 5' 3.5" (1.613 m)  Wt 153 lb (69.4 kg)  BMI 26.67 kg/m2  LMP 05/05/2015 Skin warm and dry. Neck: mid line trachea, normal thyroid, good ROM, no lymphadenopathy noted. Lungs: clear to ausculation bilaterally. Cardiovascular: regular rate and rhythm.Pelvic: external genitalia is normal in appearance no lesions, vagina: period like blood and has vaginal cyst in left side wall, about size of grape, non tender and soft,urethra has no lesions or masses noted, cervix:smooth, uterus: normal size, shape and contour, non tender, no masses felt, adnexa: no masses or tenderness noted. Bladder is non tender and no masses felt.    Assessment:     Menometrorrhagia Dysmenorrhea Bloated Fatigue  Vaginal cyst       Plan:     Check CBC,CMP,TSH and FSH Return in 2 days for gyn [place] Then return 6/27 for pap and physical  Review handout on menorrhagia,dysmneorrhea and fatigue

## 2015-05-13 LAB — TSH: TSH: 1.42 u[IU]/mL (ref 0.450–4.500)

## 2015-05-13 LAB — COMPREHENSIVE METABOLIC PANEL
A/G RATIO: 1.6 (ref 1.1–2.5)
ALT: 20 IU/L (ref 0–32)
AST: 25 IU/L (ref 0–40)
Albumin: 4.4 g/dL (ref 3.5–5.5)
Alkaline Phosphatase: 71 IU/L (ref 39–117)
BUN/Creatinine Ratio: 26 — ABNORMAL HIGH (ref 9–23)
BUN: 13 mg/dL (ref 6–24)
CALCIUM: 8.9 mg/dL (ref 8.7–10.2)
CHLORIDE: 102 mmol/L (ref 97–108)
CO2: 22 mmol/L (ref 18–29)
Creatinine, Ser: 0.5 mg/dL — ABNORMAL LOW (ref 0.57–1.00)
GFR calc non Af Amer: 112 mL/min/{1.73_m2} (ref >59)
GFR, EST AFRICAN AMERICAN: 129 mL/min/{1.73_m2} (ref >59)
GLOBULIN, TOTAL: 2.7 g/dL (ref 1.5–4.5)
GLUCOSE: 85 mg/dL (ref 65–99)
Potassium: 4.6 mmol/L (ref 3.5–5.2)
Sodium: 139 mmol/L (ref 134–144)
TOTAL PROTEIN: 7.1 g/dL (ref 6.0–8.5)

## 2015-05-13 LAB — CBC
HEMATOCRIT: 36 % (ref 34.0–46.6)
Hemoglobin: 11.4 g/dL (ref 11.1–15.9)
MCH: 26.5 pg — AB (ref 26.6–33.0)
MCHC: 31.7 g/dL (ref 31.5–35.7)
MCV: 84 fL (ref 79–97)
PLATELETS: 273 10*3/uL (ref 150–379)
RBC: 4.31 x10E6/uL (ref 3.77–5.28)
RDW: 15 % (ref 12.3–15.4)
WBC: 4.1 10*3/uL (ref 3.4–10.8)

## 2015-05-13 LAB — FOLLICLE STIMULATING HORMONE: FSH: 64.4 m[IU]/mL

## 2015-05-14 ENCOUNTER — Ambulatory Visit (INDEPENDENT_AMBULATORY_CARE_PROVIDER_SITE_OTHER): Payer: BLUE CROSS/BLUE SHIELD

## 2015-05-14 ENCOUNTER — Other Ambulatory Visit: Payer: Self-pay | Admitting: Adult Health

## 2015-05-14 DIAGNOSIS — R140 Abdominal distension (gaseous): Secondary | ICD-10-CM

## 2015-05-14 DIAGNOSIS — N946 Dysmenorrhea, unspecified: Secondary | ICD-10-CM

## 2015-05-14 DIAGNOSIS — N921 Excessive and frequent menstruation with irregular cycle: Secondary | ICD-10-CM | POA: Diagnosis not present

## 2015-05-15 ENCOUNTER — Telehealth: Payer: Self-pay | Admitting: Adult Health

## 2015-05-15 ENCOUNTER — Encounter: Payer: Self-pay | Admitting: Adult Health

## 2015-05-15 DIAGNOSIS — Z780 Asymptomatic menopausal state: Secondary | ICD-10-CM | POA: Insufficient documentation

## 2015-05-15 DIAGNOSIS — D259 Leiomyoma of uterus, unspecified: Secondary | ICD-10-CM

## 2015-05-15 DIAGNOSIS — D219 Benign neoplasm of connective and other soft tissue, unspecified: Secondary | ICD-10-CM | POA: Insufficient documentation

## 2015-05-15 HISTORY — DX: Benign neoplasm of connective and other soft tissue, unspecified: D21.9

## 2015-05-15 HISTORY — DX: Asymptomatic menopausal state: Z78.0

## 2015-05-15 NOTE — Telephone Encounter (Signed)
Pt aware of labs and [place] and need for endo biopsy has appt Monday for pap and physical, will schedule endo biopsy then.

## 2015-05-19 ENCOUNTER — Encounter: Payer: Self-pay | Admitting: Adult Health

## 2015-05-19 ENCOUNTER — Encounter (INDEPENDENT_AMBULATORY_CARE_PROVIDER_SITE_OTHER): Payer: BLUE CROSS/BLUE SHIELD | Admitting: Adult Health

## 2015-05-19 ENCOUNTER — Ambulatory Visit (INDEPENDENT_AMBULATORY_CARE_PROVIDER_SITE_OTHER): Payer: BLUE CROSS/BLUE SHIELD | Admitting: Adult Health

## 2015-05-19 ENCOUNTER — Other Ambulatory Visit (HOSPITAL_COMMUNITY)
Admission: RE | Admit: 2015-05-19 | Discharge: 2015-05-19 | Disposition: A | Discharge status: Home / Self Care | Payer: BLUE CROSS/BLUE SHIELD | Source: Ambulatory Visit | Attending: Adult Health | Admitting: Adult Health

## 2015-05-19 VITALS — BP 100/60 | HR 72 | Ht 63.5 in | Wt 152.5 lb

## 2015-05-19 DIAGNOSIS — D259 Leiomyoma of uterus, unspecified: Secondary | ICD-10-CM

## 2015-05-19 DIAGNOSIS — D219 Benign neoplasm of connective and other soft tissue, unspecified: Secondary | ICD-10-CM | POA: Insufficient documentation

## 2015-05-19 DIAGNOSIS — R5383 Other fatigue: Secondary | ICD-10-CM

## 2015-05-19 DIAGNOSIS — R140 Abdominal distension (gaseous): Secondary | ICD-10-CM

## 2015-05-19 DIAGNOSIS — Z01419 Encounter for gynecological examination (general) (routine) without abnormal findings: Secondary | ICD-10-CM | POA: Insufficient documentation

## 2015-05-19 DIAGNOSIS — N946 Dysmenorrhea, unspecified: Secondary | ICD-10-CM

## 2015-05-19 DIAGNOSIS — Z1151 Encounter for screening for human papillomavirus (HPV): Secondary | ICD-10-CM | POA: Diagnosis present

## 2015-05-19 DIAGNOSIS — N898 Other specified noninflammatory disorders of vagina: Secondary | ICD-10-CM

## 2015-05-19 DIAGNOSIS — Z1212 Encounter for screening for malignant neoplasm of rectum: Secondary | ICD-10-CM | POA: Diagnosis not present

## 2015-05-19 DIAGNOSIS — N950 Postmenopausal bleeding: Secondary | ICD-10-CM | POA: Insufficient documentation

## 2015-05-19 DIAGNOSIS — N921 Excessive and frequent menstruation with irregular cycle: Secondary | ICD-10-CM

## 2015-05-19 DIAGNOSIS — Z780 Asymptomatic menopausal state: Secondary | ICD-10-CM

## 2015-05-19 HISTORY — DX: Postmenopausal bleeding: N95.0

## 2015-05-19 HISTORY — DX: Benign neoplasm of connective and other soft tissue, unspecified: D21.9

## 2015-05-19 LAB — HEMOCCULT GUIAC POC 1CARD (OFFICE): Fecal Occult Blood, POC: NEGATIVE

## 2015-05-19 NOTE — Progress Notes (Signed)
Patient ID: Vanessa Stokes, female   DOB: 03-Oct-1961, 54 y.o.   MRN: [id number] History of Present Illness: [place] is a 54 year old white female in for well woman gyn exam and pap.She was seen last week for menometrorrhagia and bloated abdomen and fatigue and dysmenorrhea.She had an [place] and labs.   Current Medications, Allergies, Past Medical History, Past Surgical History, Family History and Social History were reviewed in [employer] record.     Review of Systems: Patient denies any headaches, hearing loss, fatigue, blurred vision, shortness of breath, chest pain, problems with bowel movements, urination, or intercourse. No joint pain or mood swings.See HPI for positives.    Physical Exam:BP 100/60 mmHg  Pulse 72  Ht 5' 3.5" (1.613 m)  Wt 152 lb 8 oz (69.174 kg)  BMI 26.59 kg/m2  LMP 05/05/2015 General:  Well developed, well nourished, no acute distress Skin:  Warm and dry Neck:  Midline trachea, normal thyroid, good ROM, no lymphadenopathy Lungs; Clear to auscultation bilaterally Breast:  No dominant palpable mass, retraction, or nipple discharge Cardiovascular: Regular rate and rhythm Abdomen:  Soft, non tender, no hepatosplenomegaly Pelvic:  External genitalia is normal in appearance, no lesions.  The vagina is normal in appearance, left vaginal side wall cyst. About size of grape or marble. Urethra has no lesions or masses. The cervix is bulbous.Pap with HPV performed.  Uterus is felt to be enlarged in size.  No adnexal masses or tenderness noted.Bladder is non tender, no masses felt. Rectal: Good sphincter tone, no polyps, or hemorrhoids felt.  Hemoccult negative. Extremities/musculoskeletal:  No swelling or varicosities noted, no clubbing or cyanosis Psych:  No mood changes, alert and cooperative,seems happy US shows:  Uterus 11.6 x 6.0 x 7.6 cm, central fibroid measuring 6.9 x 5.8 x 6.0cm. Retroverted.  Endometrium 3.7 mm,  appears symmetrical in LUS. Not visualized in mid uterus and fundal area due to large fibroid.  Right ovary 3.5 x 2.2 x 1.3 cm, appears mobile.  Left ovary 2.5 x 1.3 x 1.7 cm, appears mobile.   Technician Comments:  Retroverted uterus with large central fibroid noted. Bilateral ovaries appear normal and are mobile. Patient exhibits pain during transvaginal ultrasound when angling posterior for uterine fundus, left greater than right.  FSH was 64.4, TSH 1.420 and HGB 11.4. Discussed getting endo biopsy to assess uterine lining.  Impression: Well woman gyn exam with pap Fibroids Menometrorrhagia Dysmenorrhea Bloated abdomen Fatigue vaginal wall cyst PM by [facility] PMB   Plan: Return 6/30 for endo biopsy with Dr Glo Herring Physical in 1 year Mammogram now and yearly Colonoscopy per GI Ok to take MV wit fe  Review handouts on endo biopsy and fibroids

## 2015-05-19 NOTE — Patient Instructions (Signed)
Return 6/30 for endo biopsy with [facility] Physical in 1 year Mammogram now and yearly

## 2015-05-20 LAB — CYTOLOGY - PAP

## 2015-05-20 NOTE — Progress Notes (Signed)
This encounter was created in error - please disregard.

## 2015-05-22 ENCOUNTER — Ambulatory Visit (INDEPENDENT_AMBULATORY_CARE_PROVIDER_SITE_OTHER): Payer: BLUE CROSS/BLUE SHIELD | Admitting: Obstetrics and Gynecology

## 2015-05-22 ENCOUNTER — Encounter: Payer: Self-pay | Admitting: Obstetrics and Gynecology

## 2015-05-22 VITALS — BP 106/68 | HR 80 | Ht 63.5 in | Wt 152.0 lb

## 2015-05-22 DIAGNOSIS — Z3202 Encounter for pregnancy test, result negative: Secondary | ICD-10-CM

## 2015-05-22 DIAGNOSIS — N921 Excessive and frequent menstruation with irregular cycle: Secondary | ICD-10-CM

## 2015-05-22 LAB — POCT URINE PREGNANCY: PREG TEST UR: NEGATIVE

## 2015-05-22 NOTE — Progress Notes (Addendum)
Patient ID: Vanessa Stokes, female   DOB: 10-07-1961, 54 y.o.   MRN: [id number] Pt here for discussion of u/s and endometrial biopsy, also she has a suburethral cyst. Pt having heavy menses lasting up to a week, pt very tolerant, but frustrated See jennifer griffins notes. Endometrial Biopsy: Patient given informed consent, signed copy in the chart, time out was performed. Time out taken. The patient was placed in the lithotomy position and the cervix brought into view with sterile speculum.  Portio of cervix cleansed x 2 with betadine swabs.  A tenaculum was placed in the anterior lip of the cervix.  The uterus was sounded for depth of 8.5 cm,. Milex uterine Explora 3 mm was introduced to into the uterus, suction created,  and an endometrial sample was obtained. All equipment was removed and accounted for.   The patient tolerated the procedure well.   HPI Comments: Patient complains of dysmenorrhea and menometrorrhagia. She states she urinates a little when she runs. Patient denies difficulty defecating.  Patient's last menstrual period was 05/05/2015.   Physical Exam: Pelvic- Periurethral cyst visualized on anterior urethra.  UTERUS: retroverted, slightly enlarged.    Patient given post procedure instructions.  Followup: Results available in 4-5 days for preliminary phone call, and f/u in 2 week for in-office visit.  For [place]   This chart was SCRIBED for [name], MD by [name], ED Scribe. This patient was seen in room 2, and the patient's care was started at 4:15 PM.  I personally performed the services described in this documentation, which was SCRIBED in my presence. The recorded information has been reviewed and considered accurate. It has been edited as necessary during review. [name], MD

## 2015-05-23 NOTE — Addendum Note (Signed)
Addended by: Doyne Keel on: 05/23/2015 12:04 PM   Modules accepted: Orders

## 2015-05-27 ENCOUNTER — Other Ambulatory Visit: Payer: Self-pay | Admitting: Obstetrics and Gynecology

## 2015-05-29 ENCOUNTER — Telehealth: Payer: Self-pay | Admitting: Obstetrics and Gynecology

## 2015-05-29 NOTE — Telephone Encounter (Signed)
Benign prolif endometrium communicated to pt. She may need to f/u to discuss future plans.

## 2015-06-02 ENCOUNTER — Telehealth: Payer: Self-pay | Admitting: Adult Health

## 2015-06-02 NOTE — Telephone Encounter (Signed)
Pt wanted to know if needs to keep F/U appt I told her yes, to keep it to discuss options

## 2015-06-05 ENCOUNTER — Other Ambulatory Visit: Payer: BLUE CROSS/BLUE SHIELD

## 2015-06-06 ENCOUNTER — Other Ambulatory Visit: Payer: Self-pay | Admitting: Obstetrics and Gynecology

## 2015-06-06 ENCOUNTER — Ambulatory Visit: Payer: BLUE CROSS/BLUE SHIELD | Admitting: Obstetrics and Gynecology

## 2015-06-06 DIAGNOSIS — N921 Excessive and frequent menstruation with irregular cycle: Secondary | ICD-10-CM

## 2015-06-11 ENCOUNTER — Ambulatory Visit: Payer: BLUE CROSS/BLUE SHIELD | Admitting: Obstetrics and Gynecology

## 2015-06-11 ENCOUNTER — Other Ambulatory Visit: Payer: BLUE CROSS/BLUE SHIELD

## 2015-06-18 ENCOUNTER — Ambulatory Visit (INDEPENDENT_AMBULATORY_CARE_PROVIDER_SITE_OTHER): Payer: BLUE CROSS/BLUE SHIELD | Admitting: Obstetrics and Gynecology

## 2015-06-18 ENCOUNTER — Ambulatory Visit (INDEPENDENT_AMBULATORY_CARE_PROVIDER_SITE_OTHER): Payer: BLUE CROSS/BLUE SHIELD

## 2015-06-18 DIAGNOSIS — D251 Intramural leiomyoma of uterus: Secondary | ICD-10-CM | POA: Diagnosis not present

## 2015-06-18 DIAGNOSIS — M545 Low back pain: Secondary | ICD-10-CM

## 2015-06-18 DIAGNOSIS — D259 Leiomyoma of uterus, unspecified: Secondary | ICD-10-CM | POA: Diagnosis not present

## 2015-06-18 DIAGNOSIS — N921 Excessive and frequent menstruation with irregular cycle: Secondary | ICD-10-CM

## 2015-06-18 DIAGNOSIS — D250 Submucous leiomyoma of uterus: Secondary | ICD-10-CM | POA: Diagnosis not present

## 2015-06-18 NOTE — Progress Notes (Signed)
[facility] Visit  Patient name: Vanessa Stokes MRN [id number]  Date of birth: 1961-11-17  CC & HPI:  Vanessa Stokes is a 54 y.o. female presenting today for u/s to evaluate large central uterine fibroid. Patient complains of sore suprapubic pain, and states she feels like her suprapubic abdomen is "poking out." She states she sometimes has low back pain.    ROS:  A complete review of systems was obtained and all systems are negative except as noted in the HPI and PMH.    Pertinent History Reviewed:   Reviewed: Significant for n/a Medical         Past Medical History  Diagnosis Date  . Depression     Paxil  . Anemia   . Acromegaly   . Menometrorrhagia 05/12/2015  . Dysmenorrhea 05/12/2015  . Bloated abdomen 05/12/2015  . Fatigue 05/12/2015  . Vaginal wall cyst 05/12/2015  . Fibroid 05/15/2015  . Postmenopausal 05/15/2015  . Fibroids 05/19/2015  . Postmenopausal bleeding 05/19/2015                              Surgical Hx:    Past Surgical History  Procedure Laterality Date  . Pituitary gland tumors removed      X 2  . Colonoscopy  12/04/2008    Dr. Rourk:Friable anal canal and anal papilla, otherwise normal rectum/Left-sided diverticula polyp at the splenic flexure, status post cold snared.  The remainder of the colonic mucosa and terminal  ileum mucosa appeared normal. BENIGN polyp.   . Colonoscopy, esophagogastroduodenoscopy (egd) and esophageal dilation N/A 01/28/2014    Procedure: COLONOSCOPY, ESOPHAGOGASTRODUODENOSCOPY (EGD) AND ESOPHAGEAL DILATION (ED);  Surgeon: Daneil Dolin, MD;  Location: [facility];  Service: Endoscopy;  Laterality: N/A;  830  . Brain surgery     Medications: Reviewed & Updated - see associated section                       Current outpatient prescriptions:  .  ALPRAZolam (XANAX) 0.25 MG tablet, Take 0.25 mg by mouth as needed for anxiety. , Disp: , Rfl:  .  ibuprofen (ADVIL,MOTRIN) 200 MG tablet, Take 200 mg by mouth every 6 (six)  hours as needed., Disp: , Rfl:  .  IRON PO, Take by mouth daily., Disp: , Rfl:  .  PARoxetine (PAXIL) 20 MG tablet, Take 20 mg by mouth every morning., Disp: , Rfl:  .  Vitamin D, Ergocalciferol, (DRISDOL) 50000 UNITS CAPS capsule, 50,000 Units every 7 (seven) days. , Disp: , Rfl: 0   Social History: Reviewed -  reports that she has been smoking Cigarettes.  She has been smoking about 0.50 packs per day. She has never used smokeless tobacco.  Objective Findings:  Vitals: There were no vitals taken for this visit.  Physical Examination: General appearance - alert, well appearing, and in no distress, oriented to person, place, and time and overweight Mental status - alert, oriented to person, place, and time, normal mood, behavior, speech, dress, motor activity, and thought processes, affect appropriate to mood Pelvic - normal external genitalia, vulva, vagina, cervix, uterus and adnexa,  VULVA: normal appearing vulva with no masses, tenderness or lesions,  VAGINA: normal appearing vagina with normal color and discharge, no lesions,  CERVIX: anterior UTERUS: uterus is retroverted, slight tenderness to palpation  ADNEXA: normal adnexa in size, nontender and no masses  Sonohysterogram shows a pedunculated submucosal  fibroid.   Sonohysterogram discussed with patient in lengthy discussion. Discussed several possible intervention options, including (1) hysteroscopy to remove fibroid, (2) laparoscopic vaginal hysterectomy, preceded by Lupron injections, or (3) wait and monitor symptoms. Pt elects option 3, wait and monitor.    Assessment & Plan:   A:  1.  Large pedunculated submucosal uterine fibroid with several endometrial  myomas, visualized on sonohysterogram  P:  1. Pt not inclined to consider intervention at this time, will re-assess q6 months  2. Schedule f/u visit in 6 months   This chart was SCRIBED for [name], MD by [name], ED Scribe. This patient was seen in the  ultrasound room, and the patient's care was started at 10:15 AM.  I personally performed the services described in this documentation, which was SCRIBED in my presence. The recorded information has been reviewed and considered accurate. It has been edited as necessary during review. [name], MD

## 2015-06-18 NOTE — Progress Notes (Signed)
[place] Sonohysterogram: Dr Glo Herring preformed SH, saline outlined a 5 x 3.6 x 3 cm pedunculated submucosal fibroid.

## 2016-06-03 DIAGNOSIS — Z6826 Body mass index (BMI) 26.0-26.9, adult: Secondary | ICD-10-CM | POA: Diagnosis not present

## 2016-06-03 DIAGNOSIS — Z1389 Encounter for screening for other disorder: Secondary | ICD-10-CM | POA: Diagnosis not present

## 2016-06-03 DIAGNOSIS — Z719 Counseling, unspecified: Secondary | ICD-10-CM | POA: Diagnosis not present

## 2016-06-03 DIAGNOSIS — Z0001 Encounter for general adult medical examination with abnormal findings: Secondary | ICD-10-CM | POA: Diagnosis not present

## 2016-06-03 DIAGNOSIS — E663 Overweight: Secondary | ICD-10-CM | POA: Diagnosis not present

## 2016-06-05 ENCOUNTER — Other Ambulatory Visit: Payer: Self-pay | Admitting: Family Medicine

## 2016-06-05 DIAGNOSIS — Z1231 Encounter for screening mammogram for malignant neoplasm of breast: Secondary | ICD-10-CM

## 2016-06-07 DIAGNOSIS — Z1389 Encounter for screening for other disorder: Secondary | ICD-10-CM | POA: Diagnosis not present

## 2016-06-07 DIAGNOSIS — Z6826 Body mass index (BMI) 26.0-26.9, adult: Secondary | ICD-10-CM | POA: Diagnosis not present

## 2016-06-07 DIAGNOSIS — Z0001 Encounter for general adult medical examination with abnormal findings: Secondary | ICD-10-CM | POA: Diagnosis not present

## 2016-10-11 DIAGNOSIS — B999 Unspecified infectious disease: Secondary | ICD-10-CM | POA: Diagnosis not present

## 2016-10-11 DIAGNOSIS — B369 Superficial mycosis, unspecified: Secondary | ICD-10-CM | POA: Diagnosis not present

## 2016-10-25 DIAGNOSIS — Z8639 Personal history of other endocrine, nutritional and metabolic disease: Secondary | ICD-10-CM | POA: Diagnosis not present

## 2016-10-25 DIAGNOSIS — E220 Acromegaly and pituitary gigantism: Secondary | ICD-10-CM | POA: Diagnosis not present

## 2016-12-08 ENCOUNTER — Other Ambulatory Visit (HOSPITAL_COMMUNITY): Payer: Self-pay | Admitting: Family Medicine

## 2016-12-08 DIAGNOSIS — Z1231 Encounter for screening mammogram for malignant neoplasm of breast: Secondary | ICD-10-CM

## 2016-12-15 ENCOUNTER — Encounter (HOSPITAL_COMMUNITY): Payer: Self-pay | Admitting: Radiology

## 2016-12-15 ENCOUNTER — Ambulatory Visit (HOSPITAL_COMMUNITY)
Admission: RE | Admit: 2016-12-15 | Discharge: 2016-12-15 | Disposition: A | Discharge status: Home / Self Care | Payer: BLUE CROSS/BLUE SHIELD | Source: Ambulatory Visit | Attending: Family Medicine | Admitting: Family Medicine

## 2016-12-15 DIAGNOSIS — Z1231 Encounter for screening mammogram for malignant neoplasm of breast: Secondary | ICD-10-CM | POA: Diagnosis not present

## 2016-12-27 DIAGNOSIS — Z1389 Encounter for screening for other disorder: Secondary | ICD-10-CM | POA: Diagnosis not present

## 2016-12-27 DIAGNOSIS — K5900 Constipation, unspecified: Secondary | ICD-10-CM | POA: Diagnosis not present

## 2016-12-27 DIAGNOSIS — K625 Hemorrhage of anus and rectum: Secondary | ICD-10-CM | POA: Diagnosis not present

## 2016-12-27 DIAGNOSIS — Z6827 Body mass index (BMI) 27.0-27.9, adult: Secondary | ICD-10-CM | POA: Diagnosis not present

## 2016-12-27 DIAGNOSIS — R1900 Intra-abdominal and pelvic swelling, mass and lump, unspecified site: Secondary | ICD-10-CM | POA: Diagnosis not present

## 2016-12-27 DIAGNOSIS — R5383 Other fatigue: Secondary | ICD-10-CM | POA: Diagnosis not present

## 2017-01-25 ENCOUNTER — Ambulatory Visit: Payer: BLUE CROSS/BLUE SHIELD | Admitting: Gastroenterology

## 2017-02-07 ENCOUNTER — Ambulatory Visit (HOSPITAL_COMMUNITY)
Admission: RE | Admit: 2017-02-07 | Discharge: 2017-02-07 | Disposition: A | Discharge status: Home / Self Care | Payer: BLUE CROSS/BLUE SHIELD | Source: Ambulatory Visit | Attending: Registered Nurse | Admitting: Registered Nurse

## 2017-02-07 ENCOUNTER — Other Ambulatory Visit (HOSPITAL_COMMUNITY): Payer: Self-pay | Admitting: Registered Nurse

## 2017-02-07 DIAGNOSIS — E220 Acromegaly and pituitary gigantism: Secondary | ICD-10-CM | POA: Diagnosis not present

## 2017-02-07 DIAGNOSIS — M25551 Pain in right hip: Secondary | ICD-10-CM

## 2017-02-07 DIAGNOSIS — Z6826 Body mass index (BMI) 26.0-26.9, adult: Secondary | ICD-10-CM | POA: Diagnosis not present

## 2017-02-07 DIAGNOSIS — E669 Obesity, unspecified: Secondary | ICD-10-CM | POA: Diagnosis not present

## 2017-02-07 DIAGNOSIS — Z1389 Encounter for screening for other disorder: Secondary | ICD-10-CM | POA: Diagnosis not present

## 2017-02-07 DIAGNOSIS — S79911A Unspecified injury of right hip, initial encounter: Secondary | ICD-10-CM | POA: Diagnosis not present

## 2017-02-07 DIAGNOSIS — W19XXXA Unspecified fall, initial encounter: Secondary | ICD-10-CM | POA: Diagnosis not present

## 2017-02-17 ENCOUNTER — Encounter: Payer: Self-pay | Admitting: Gastroenterology

## 2017-02-17 ENCOUNTER — Telehealth: Payer: Self-pay | Admitting: Gastroenterology

## 2017-02-17 ENCOUNTER — Ambulatory Visit: Payer: BLUE CROSS/BLUE SHIELD | Admitting: Gastroenterology

## 2017-02-17 NOTE — Telephone Encounter (Signed)
Mailed letter °

## 2017-02-17 NOTE — Telephone Encounter (Signed)
Pt was a no show

## 2017-05-14 DIAGNOSIS — L219 Seborrheic dermatitis, unspecified: Secondary | ICD-10-CM | POA: Diagnosis not present

## 2017-10-17 DIAGNOSIS — Z1389 Encounter for screening for other disorder: Secondary | ICD-10-CM | POA: Diagnosis not present

## 2017-10-17 DIAGNOSIS — E663 Overweight: Secondary | ICD-10-CM | POA: Diagnosis not present

## 2017-10-17 DIAGNOSIS — L089 Local infection of the skin and subcutaneous tissue, unspecified: Secondary | ICD-10-CM | POA: Diagnosis not present

## 2017-10-17 DIAGNOSIS — F419 Anxiety disorder, unspecified: Secondary | ICD-10-CM | POA: Diagnosis not present

## 2017-10-17 DIAGNOSIS — Z6825 Body mass index (BMI) 25.0-25.9, adult: Secondary | ICD-10-CM | POA: Diagnosis not present

## 2017-10-17 DIAGNOSIS — F329 Major depressive disorder, single episode, unspecified: Secondary | ICD-10-CM | POA: Diagnosis not present

## 2017-11-24 ENCOUNTER — Emergency Department (HOSPITAL_COMMUNITY)
Admission: EM | Admit: 2017-11-24 | Discharge: 2017-11-25 | Disposition: A | Discharge status: Home / Self Care | Payer: BLUE CROSS/BLUE SHIELD | Attending: Emergency Medicine | Admitting: Emergency Medicine

## 2017-11-24 ENCOUNTER — Other Ambulatory Visit: Payer: Self-pay

## 2017-11-24 ENCOUNTER — Emergency Department (HOSPITAL_COMMUNITY): Payer: BLUE CROSS/BLUE SHIELD

## 2017-11-24 ENCOUNTER — Encounter (HOSPITAL_COMMUNITY): Payer: Self-pay | Admitting: Emergency Medicine

## 2017-11-24 DIAGNOSIS — F17210 Nicotine dependence, cigarettes, uncomplicated: Secondary | ICD-10-CM | POA: Diagnosis not present

## 2017-11-24 DIAGNOSIS — Z79899 Other long term (current) drug therapy: Secondary | ICD-10-CM | POA: Insufficient documentation

## 2017-11-24 DIAGNOSIS — R109 Unspecified abdominal pain: Secondary | ICD-10-CM | POA: Diagnosis not present

## 2017-11-24 DIAGNOSIS — R1030 Lower abdominal pain, unspecified: Secondary | ICD-10-CM | POA: Diagnosis not present

## 2017-11-24 DIAGNOSIS — K659 Peritonitis, unspecified: Secondary | ICD-10-CM | POA: Diagnosis not present

## 2017-11-24 DIAGNOSIS — R1031 Right lower quadrant pain: Secondary | ICD-10-CM

## 2017-11-24 DIAGNOSIS — R110 Nausea: Secondary | ICD-10-CM | POA: Diagnosis not present

## 2017-11-24 LAB — COMPREHENSIVE METABOLIC PANEL
ALT: 24 U/L (ref 14–54)
ANION GAP: 11 (ref 5–15)
AST: 24 U/L (ref 15–41)
Albumin: 4.2 g/dL (ref 3.5–5.0)
Alkaline Phosphatase: 89 U/L (ref 38–126)
BUN: 12 mg/dL (ref 6–20)
CALCIUM: 9.7 mg/dL (ref 8.9–10.3)
CHLORIDE: 105 mmol/L (ref 101–111)
CO2: 24 mmol/L (ref 22–32)
Creatinine, Ser: 0.63 mg/dL (ref 0.44–1.00)
GFR calc non Af Amer: >60 mL/min (ref >60)
Glucose, Bld: 99 mg/dL (ref 65–99)
POTASSIUM: 3.7 mmol/L (ref 3.5–5.1)
SODIUM: 140 mmol/L (ref 135–145)
Total Bilirubin: 0.6 mg/dL (ref 0.3–1.2)
Total Protein: 7.7 g/dL (ref 6.5–8.1)

## 2017-11-24 LAB — URINALYSIS, ROUTINE W REFLEX MICROSCOPIC
Bacteria, UA: NONE SEEN
Bilirubin Urine: NEGATIVE
Glucose, UA: NEGATIVE mg/dL
HGB URINE DIPSTICK: NEGATIVE
Ketones, ur: NEGATIVE mg/dL
Nitrite: NEGATIVE
PROTEIN: NEGATIVE mg/dL
Specific Gravity, Urine: 1.002 — ABNORMAL LOW (ref 1.005–1.030)
WBC UA: NONE SEEN WBC/hpf (ref 0–5)
pH: 5 (ref 5.0–8.0)

## 2017-11-24 LAB — CBC
HEMATOCRIT: 38.7 % (ref 36.0–46.0)
HEMOGLOBIN: 12.4 g/dL (ref 12.0–15.0)
MCH: 29.6 pg (ref 26.0–34.0)
MCHC: 32 g/dL (ref 30.0–36.0)
MCV: 92.4 fL (ref 78.0–100.0)
Platelets: 202 10*3/uL (ref 150–400)
RBC: 4.19 MIL/uL (ref 3.87–5.11)
RDW: 12.8 % (ref 11.5–15.5)
WBC: 5.2 10*3/uL (ref 4.0–10.5)

## 2017-11-24 LAB — LIPASE, BLOOD: LIPASE: 28 U/L (ref 11–51)

## 2017-11-24 MED ORDER — IOPAMIDOL (ISOVUE-300) INJECTION 61%
100.0000 mL | Freq: Once | INTRAVENOUS | Status: AC | PRN
  Administered 2017-11-24: 100 mL via INTRAVENOUS

## 2017-11-24 MED ORDER — HYDROCODONE-ACETAMINOPHEN 5-325 MG PO TABS: ORAL_TABLET | ORAL | 0 refills | Status: DC

## 2017-11-24 NOTE — ED Provider Notes (Signed)
[facility] EMERGENCY DEPARTMENT Provider Note   CSN: [id number] Arrival date & time: 11/24/17  1657     History   Chief Complaint Chief Complaint  Patient presents with  . Abdominal Pain    HPI NEVADA KIRCHNER is a 57 y.o. female.  HPI   ALEXISS ITURRALDE is a 57 y.o. female who presents to the Emergency Department complaining of diffuse lower abdominal pain and bloating with intermittent nausea.  Symptoms present for one week.  She states the pain feels as though it radiates into her lower back as well.  Pain waxes and wanes in severity .  Nothing makes it better or worse.  Pain is not associated with food.  She denies fever, chest pain, shortness of breath, dysuria, constipation or vomiting.  She has not tried any medications for symptom relief.    Past Medical History:  Diagnosis Date  . Acromegaly ([place])   . Anemia   . Bloated abdomen 05/12/2015  . Depression    Paxil  . Dysmenorrhea 05/12/2015  . Fatigue 05/12/2015  . Fibroid 05/15/2015  . Fibroids 05/19/2015  . Menometrorrhagia 05/12/2015  . Postmenopausal 05/15/2015  . Postmenopausal bleeding 05/19/2015  . Vaginal wall cyst 05/12/2015    Patient Active Problem List   Diagnosis Date Noted  . Fibroids 05/19/2015  . Postmenopausal bleeding 05/19/2015  . Fibroid 05/15/2015  . Postmenopausal 05/15/2015  . Menometrorrhagia 05/12/2015  . Dysmenorrhea 05/12/2015  . Bloated abdomen 05/12/2015  . Fatigue 05/12/2015  . Vaginal wall cyst 05/12/2015  . Anemia 01/02/2014  . Other dysphagia 01/02/2014    Past Surgical History:  Procedure Laterality Date  . BRAIN SURGERY    . COLONOSCOPY  12/04/2008   Dr. Rourk:Friable anal canal and anal papilla, otherwise normal rectum/Left-sided diverticula polyp at the splenic flexure, status post cold snared.  The remainder of the colonic mucosa and terminal  ileum mucosa appeared normal. BENIGN polyp.   . COLONOSCOPY, ESOPHAGOGASTRODUODENOSCOPY (EGD) AND ESOPHAGEAL DILATION N/A  01/28/2014   Procedure: COLONOSCOPY, ESOPHAGOGASTRODUODENOSCOPY (EGD) AND ESOPHAGEAL DILATION (ED);  Surgeon: Daneil Dolin, MD;  Location: [facility];  Service: Endoscopy;  Laterality: N/A;  830  . pituitary gland tumors removed     X 2    OB History    Gravida Para Term Preterm AB Living   2 1     1 1    SAB TAB Ectopic Multiple Live Births   1               Home Medications    Prior to Admission medications   Medication Sig Start Date End Date Taking? Authorizing Provider  ALPRAZolam Duanne Moron) 0.25 MG tablet Take 0.25 mg by mouth as needed for anxiety.     [provider]  ibuprofen (ADVIL,MOTRIN) 200 MG tablet Take 200 mg by mouth every 6 (six) hours as needed.    [provider]  IRON PO Take by mouth daily.    [provider]  PARoxetine (PAXIL) 20 MG tablet Take 20 mg by mouth every morning.    [provider]  Vitamin D, Ergocalciferol, (DRISDOL) 50000 UNITS CAPS capsule 50,000 Units every 7 (seven) days.  04/16/15   [provider]    Family History Family History  Problem Relation Age of Onset  . Cancer Mother        lung  . Cancer Father        lung  . Other Daughter        hip problems;  degenerative hip dysplasia  . Colon cancer Neg Hx     Social History Social History   Tobacco Use  . Smoking status: Current Every Day Smoker    Packs/day: 0.50    Types: Cigarettes  . Smokeless tobacco: Never Used  . Tobacco comment: Smokes about 3 cigarettes daily  Substance Use Topics  . Alcohol use: Yes    Alcohol/week: 0.0 oz    Comment: occ beer  . Drug use: No     Allergies   Patient has no known allergies.   Review of Systems Review of Systems  Constitutional: Negative for appetite change, chills and fever.  Respiratory: Negative for shortness of breath.   Cardiovascular: Negative for chest pain.  Gastrointestinal: Positive for abdominal pain and nausea. Negative for blood in stool, constipation, diarrhea and  vomiting.  Genitourinary: Negative for decreased urine volume, difficulty urinating, dysuria and flank pain.  Musculoskeletal: Negative for back pain.  Skin: Negative for color change and rash.  Neurological: Negative for dizziness, weakness and numbness.  Hematological: Negative for adenopathy.  All other systems reviewed and are negative.    Physical Exam Updated Vital Signs BP 112/77 (BP Location: Left Arm)   Pulse (!) 56   Temp 98.4 F (36.9 C) (Oral)   Resp 20   Ht 5\' 4"  (1.626 m)   Wt 67.1 kg (148 lb)   SpO2 98%   BMI 25.40 kg/m   Physical Exam  Constitutional: She is oriented to person, place, and time. She appears well-developed and well-nourished. No distress.  HENT:  Head: Normocephalic and atraumatic.  Mouth/Throat: Oropharynx is clear and moist.  Cardiovascular: Normal rate, regular rhythm, normal heart sounds and intact distal pulses.  No murmur heard. Pulmonary/Chest: Effort normal and breath sounds normal. No respiratory distress.  Abdominal: Soft. Bowel sounds are normal. She exhibits no distension and no mass. There is tenderness. There is no rebound and no guarding.  Abdomen is soft, mild tenderness with deep palpation to the right lower abdomen.  No guarding or rebound tenderness.  No CVA tenderness  Musculoskeletal: Normal range of motion. She exhibits no edema.  Neurological: She is alert and oriented to person, place, and time. She exhibits normal muscle tone. Coordination normal.  Skin: Skin is warm and dry. Capillary refill takes less than 2 seconds.  Nursing note and vitals reviewed.    ED Treatments / Results  Labs (all labs ordered are listed, but only abnormal results are displayed) Labs Reviewed  URINALYSIS, ROUTINE W REFLEX MICROSCOPIC - Abnormal; Notable for the following components:      Result Value   Color, Urine COLORLESS (*)    Specific Gravity, Urine 1.002 (*)    Leukocytes, UA TRACE (*)    Squamous Epithelial / LPF 0-5 (*)    All  other components within normal limits  LIPASE, BLOOD  COMPREHENSIVE METABOLIC PANEL  CBC    EKG  EKG Interpretation None       Radiology Ct Abdomen Pelvis W Contrast  Result Date: 11/24/2017 CLINICAL DATA:  Abdominal distension. Abdominal pain radiating to the back. EXAM: CT ABDOMEN AND PELVIS WITH CONTRAST TECHNIQUE: Multidetector CT imaging of the abdomen and pelvis was performed using the standard protocol following bolus administration of intravenous contrast. CONTRAST:  128mL ISOVUE-300 IOPAMIDOL (ISOVUE-300) INJECTION 61% COMPARISON:  None. FINDINGS: Lower chest: The lung bases are clear. Hepatobiliary: Subcentimeter low-density in the left lobe of the liver is too small to characterize but likely small cyst or hemangioma. Gallbladder physiologically distended, no  calcified stone. No biliary dilatation. Pancreas: Tiny 5 mm hypodensity in the pancreatic head. No ductal dilatation or inflammation. Spleen: Normal in size without focal abnormality. Adrenals/Urinary Tract: No adrenal nodule. No hydronephrosis or perinephric edema. Homogeneous renal enhancement with symmetric excretion on delayed phase imaging. Tiny subcentimeter hypodensity in the lower right kidney is too small to characterize but likely small cyst. Urinary bladder is partially distended without wall thickening. Stomach/Bowel: Stomach is physiologically distended. No small bowel wall thickening, inflammation or obstruction. Appendix measures 7 mm, upper normal. There is periappendiceal fat stranding adjacent to the appendix primarily anteriorly with a low-density 15 mm structure, possibly fat lobule with central hyperdensity, best appreciated on coronal reformat image 25 series 4. Inflammation appears centered on this structure rather than the appendix itself. Moderate colonic stool burden.  No colonic wall thickening. Vascular/Lymphatic: Normal caliber abdominal aorta. No enlarged abdominal or pelvic lymph nodes. Reproductive:  Uterus is globular with heterogeneous low-density lesions 1 of which is calcified consistent with fibroids. No adnexal mass. Other: No free fluid or free air. Minimal fat in the left inguinal canal. Musculoskeletal: There are no acute or suspicious osseous abnormalities. IMPRESSION: 1. Right lower quadrant stranding and inflammation adjacent to the appendix. Periappendiceal low-density structure has the appearance of an inflamed fat lobule. Epiploic appendagitis of the appendix is favored. Appendiceal diverticulitis is also considered, but felt less likely. The appearance is not classic for acute appendicitis, as the appendix itself is otherwise normal at the upper limits of size. 2. Globular heterogeneous uterus likely secondary to fibroids. 3. Tiny 5 mm hypodensity in the pancreatic head is nonspecific. Recommend follow up pre and post contrast MRI/MRCP or pancreatic protocol CT in 1 year. This recommendation follows ACR consensus guidelines: Management of Incidental Pancreatic Cysts: A White Paper of the ACR Incidental Findings Committee. [place] 3614;[id number]. Electronically Signed   By: Jeb Levering M.D.   On: 11/24/2017 22:28    Procedures Procedures (including critical care time)  Medications Ordered in ED Medications  iopamidol (ISOVUE-300) 61 % injection 100 mL (100 mLs Intravenous Contrast Given 11/24/17 2144)     Initial Impression / Assessment and Plan / ED Course  I have reviewed the triage vital signs and the nursing notes.  Pertinent labs & imaging results that were available during my care of the patient were reviewed by me and considered in my medical decision making (see chart for details).    Pt with one week hx of diffuse lower abd pain, here she is well appearing,  Afebrile, no vomiting,  No leucocytosis.    19  Consulted surgery, Dr. Constance Haw, and discussed CT findings. Pt given option of admission for obs or go home with strict return precautions.  Pt prefers  to go home and agrees to return here tomorrow morning for recheck and repeat CBC.      Final Clinical Impressions(s) / ED Diagnoses   Final diagnoses:  Right lower quadrant abdominal pain    ED Discharge Orders    None       Bufford Lope 11/24/17 2355    Mesner, Corene Cornea, MD 11/25/17 [phone]

## 2017-11-24 NOTE — ED Triage Notes (Signed)
Patient complaining of abdominal pain radiating into back x 1 week. Denies dysuria.

## 2017-11-24 NOTE — Discharge Instructions (Signed)
As discussed, return here tomorrow morning for recheck and repeat blood work.  Also return here sooner if you develop fever, increasing pain, and/or vomiting

## 2017-11-25 ENCOUNTER — Emergency Department (HOSPITAL_COMMUNITY)
Admission: EM | Admit: 2017-11-25 | Discharge: 2017-11-25 | Disposition: A | Discharge status: Home / Self Care | Payer: BLUE CROSS/BLUE SHIELD | Source: Home / Self Care | Attending: Emergency Medicine | Admitting: Emergency Medicine

## 2017-11-25 ENCOUNTER — Encounter (HOSPITAL_COMMUNITY): Payer: Self-pay | Admitting: Emergency Medicine

## 2017-11-25 DIAGNOSIS — F17210 Nicotine dependence, cigarettes, uncomplicated: Secondary | ICD-10-CM | POA: Insufficient documentation

## 2017-11-25 DIAGNOSIS — Z79899 Other long term (current) drug therapy: Secondary | ICD-10-CM | POA: Insufficient documentation

## 2017-11-25 DIAGNOSIS — K6389 Other specified diseases of intestine: Secondary | ICD-10-CM

## 2017-11-25 DIAGNOSIS — R1030 Lower abdominal pain, unspecified: Secondary | ICD-10-CM | POA: Diagnosis not present

## 2017-11-25 DIAGNOSIS — K659 Peritonitis, unspecified: Secondary | ICD-10-CM | POA: Diagnosis not present

## 2017-11-25 DIAGNOSIS — K529 Noninfective gastroenteritis and colitis, unspecified: Secondary | ICD-10-CM | POA: Insufficient documentation

## 2017-11-25 LAB — CBC WITH DIFFERENTIAL/PLATELET
BASOS ABS: 0.1 10*3/uL (ref 0.0–0.1)
BASOS PCT: 2 %
EOS PCT: 7 %
Eosinophils Absolute: 0.3 10*3/uL (ref 0.0–0.7)
HCT: 40.9 % (ref 36.0–46.0)
Hemoglobin: 13.1 g/dL (ref 12.0–15.0)
Lymphocytes Relative: 31 %
Lymphs Abs: 1.3 10*3/uL (ref 0.7–4.0)
MCH: 29.8 pg (ref 26.0–34.0)
MCHC: 32 g/dL (ref 30.0–36.0)
MCV: 93 fL (ref 78.0–100.0)
MONO ABS: 0.3 10*3/uL (ref 0.1–1.0)
Monocytes Relative: 7 %
NEUTROS ABS: 2.2 10*3/uL (ref 1.7–7.7)
Neutrophils Relative %: 53 %
PLATELETS: 205 10*3/uL (ref 150–400)
RBC: 4.4 MIL/uL (ref 3.87–5.11)
RDW: 13.1 % (ref 11.5–15.5)
WBC: 4.1 10*3/uL (ref 4.0–10.5)

## 2017-11-25 NOTE — ED Provider Notes (Signed)
[facility] EMERGENCY DEPARTMENT Provider Note   CSN: [id number] Arrival date & time: 11/25/17  0703     History   Chief Complaint Chief Complaint  Patient presents with  . Abdominal Pain    HPI Vanessa Stokes is a 57 y.o. female.  HPI Patient presents with right-sided abdominal pain recheck.  She was seen in the ER yesterday after a couple days of abdominal pain.  Lab work was reassuring and CT scan showed likely epiploic appendage otitis.  However is right in the area of the appendix potentially off the appendix.  Was told to return today for recheck.  Patient states pain is stable to improved somewhat.  Still some pain with walking.  No fevers or chills.  No diarrhea or constipation. Past Medical History:  Diagnosis Date  . Acromegaly ([place])   . Anemia   . Bloated abdomen 05/12/2015  . Depression    Paxil  . Dysmenorrhea 05/12/2015  . Fatigue 05/12/2015  . Fibroid 05/15/2015  . Fibroids 05/19/2015  . Menometrorrhagia 05/12/2015  . Postmenopausal 05/15/2015  . Postmenopausal bleeding 05/19/2015  . Vaginal wall cyst 05/12/2015    Patient Active Problem List   Diagnosis Date Noted  . Fibroids 05/19/2015  . Postmenopausal bleeding 05/19/2015  . Fibroid 05/15/2015  . Postmenopausal 05/15/2015  . Menometrorrhagia 05/12/2015  . Dysmenorrhea 05/12/2015  . Bloated abdomen 05/12/2015  . Fatigue 05/12/2015  . Vaginal wall cyst 05/12/2015  . Anemia 01/02/2014  . Other dysphagia 01/02/2014    Past Surgical History:  Procedure Laterality Date  . BRAIN SURGERY    . COLONOSCOPY  12/04/2008   Dr. Rourk:Friable anal canal and anal papilla, otherwise normal rectum/Left-sided diverticula polyp at the splenic flexure, status post cold snared.  The remainder of the colonic mucosa and terminal  ileum mucosa appeared normal. BENIGN polyp.   . COLONOSCOPY, ESOPHAGOGASTRODUODENOSCOPY (EGD) AND ESOPHAGEAL DILATION N/A 01/28/2014   Procedure: COLONOSCOPY, ESOPHAGOGASTRODUODENOSCOPY (EGD) AND  ESOPHAGEAL DILATION (ED);  Surgeon: Daneil Dolin, MD;  [facility]: [facility];  Service: Endoscopy;  Laterality: N/A;  830  . pituitary gland tumors removed     X 2    OB History    Gravida Para Term Preterm AB Living   2 1     1 1    SAB TAB Ectopic Multiple Live Births   1               Home Medications    Prior to Admission medications   Medication Sig Start Date End Date Taking? Authorizing Provider  ALPRAZolam Duanne Moron) 0.25 MG tablet Take 0.25 mg by mouth at bedtime as needed and may repeat dose one time if needed for anxiety.     [provider]  HYDROcodone-acetaminophen (NORCO/VICODIN) 5-325 MG tablet Take one tab po q 4 hrs prn pain 11/24/17   Triplett, Tammy, PA-C  PARoxetine (PAXIL) 20 MG tablet Take 20 mg by mouth every morning.    [provider]    Family History Family History  Problem Relation Age of Onset  . Cancer Mother        lung  . Cancer Father        lung  . Other Daughter        hip problems; degenerative hip dysplasia  . Colon cancer Neg Hx     Social History Social History   Tobacco Use  . Smoking status: Current Every Day Smoker    Packs/day: 0.50    Types: Cigarettes  . Smokeless  tobacco: Never Used  . Tobacco comment: Smokes about 3 cigarettes daily  Substance Use Topics  . Alcohol use: Yes    Alcohol/week: 0.0 oz    Comment: occ beer  . Drug use: No     Allergies   Patient has no known allergies.   Review of Systems Review of Systems  Constitutional: Negative for appetite change, chills and fever.  HENT: Negative for congestion.   Respiratory: Negative for shortness of breath.   Cardiovascular: Negative for chest pain.  Gastrointestinal: Positive for abdominal pain. Negative for diarrhea and vomiting.  Genitourinary: Negative for dysuria.  Musculoskeletal: Positive for back pain.  Skin: Negative for rash.     Physical Exam Updated Vital Signs BP 112/66   Pulse 74   Temp 98.4 F (36.9 C) (Oral)    Resp 17   Ht 5\' 4"  (1.626 m)   Wt 67.1 kg (148 lb)   SpO2 100%   BMI 25.40 kg/m   Physical Exam  Constitutional: She appears well-developed.  HENT:  Head: Normocephalic.  Cardiovascular: Normal rate.  Pulmonary/Chest: Breath sounds normal.  Abdominal: Normal appearance.  Lower abdominal tenderness without rebound or guarding.  No hernia palpated.  Skin: Skin is warm.     ED Treatments / Results  Labs (all labs ordered are listed, but only abnormal results are displayed) Labs Reviewed  CBC WITH DIFFERENTIAL/PLATELET    EKG  EKG Interpretation None       Radiology Ct Abdomen Pelvis W Contrast  Result Date: 11/24/2017 CLINICAL DATA:  Abdominal distension. Abdominal pain radiating to the back. EXAM: CT ABDOMEN AND PELVIS WITH CONTRAST TECHNIQUE: Multidetector CT imaging of the abdomen and pelvis was performed using the standard protocol following bolus administration of intravenous contrast. CONTRAST:  112mL ISOVUE-300 IOPAMIDOL (ISOVUE-300) INJECTION 61% COMPARISON:  None. FINDINGS: Lower chest: The lung bases are clear. Hepatobiliary: Subcentimeter low-density in the left lobe of the liver is too small to characterize but likely small cyst or hemangioma. Gallbladder physiologically distended, no calcified stone. No biliary dilatation. Pancreas: Tiny 5 mm hypodensity in the pancreatic head. No ductal dilatation or inflammation. Spleen: Normal in size without focal abnormality. Adrenals/Urinary Tract: No adrenal nodule. No hydronephrosis or perinephric edema. Homogeneous renal enhancement with symmetric excretion on delayed phase imaging. Tiny subcentimeter hypodensity in the lower right kidney is too small to characterize but likely small cyst. Urinary bladder is partially distended without wall thickening. Stomach/Bowel: Stomach is physiologically distended. No small bowel wall thickening, inflammation or obstruction. Appendix measures 7 mm, upper normal. There is periappendiceal fat  stranding adjacent to the appendix primarily anteriorly with a low-density 15 mm structure, possibly fat lobule with central hyperdensity, best appreciated on coronal reformat image 25 series 4. Inflammation appears centered on this structure rather than the appendix itself. Moderate colonic stool burden.  No colonic wall thickening. Vascular/Lymphatic: Normal caliber abdominal aorta. No enlarged abdominal or pelvic lymph nodes. Reproductive: Uterus is globular with heterogeneous low-density lesions 1 of which is calcified consistent with fibroids. No adnexal mass. Other: No free fluid or free air. Minimal fat in the left inguinal canal. Musculoskeletal: There are no acute or suspicious osseous abnormalities. IMPRESSION: 1. Right lower quadrant stranding and inflammation adjacent to the appendix. Periappendiceal low-density structure has the appearance of an inflamed fat lobule. Epiploic appendagitis of the appendix is favored. Appendiceal diverticulitis is also considered, but felt less likely. The appearance is not classic for acute appendicitis, as the appendix itself is otherwise normal at the upper limits of size.  2. Globular heterogeneous uterus likely secondary to fibroids. 3. Tiny 5 mm hypodensity in the pancreatic head is nonspecific. Recommend follow up pre and post contrast MRI/MRCP or pancreatic protocol CT in 1 year. This recommendation follows ACR consensus guidelines: Management of Incidental Pancreatic Cysts: A White Paper of the ACR Incidental Findings Committee. [place] [id number]. Electronically Signed   By: Jeb Levering M.D.   On: 11/24/2017 22:28    Procedures Procedures (including critical care time)  Medications Ordered in ED Medications - No data to display   Initial Impression / Assessment and Plan / ED Course  I have reviewed the triage vital signs and the nursing notes.  Pertinent labs & imaging results that were available during my care of the patient were  reviewed by me and considered in my medical decision making (see chart for details).     Patient here for abdominal pain recheck.  Reviewed CT scan from yesterday.  Epiploic appendage otitis felt more likely.  Has some tenderness but still has no elevation of white count and no fevers.  Discussed with patient.  If pain improves likely needs no real follow-up.  If continues to have pain may need more follow-up.  Also informed of results in the pancreas that will need a follow-up MRI or CT scan in 1 year.  Final Clinical Impressions(s) / ED Diagnoses   Final diagnoses:  Epiploic appendagitis    ED Discharge Orders    None       Davonna Belling, MD 11/25/17 [phone]

## 2017-11-25 NOTE — ED Triage Notes (Signed)
Pt reports right sided abd pain for a week.  Was seen last night and told to come back this morning for blood work and recheck.

## 2017-11-25 NOTE — Discharge Instructions (Signed)
Watch for fevers increasing abdominal pain.  Return for worsening symptoms.  Follow-up with your primary care doctor in 1 year for the findings on the CAT scan in your pancreas.

## 2017-11-28 MED FILL — Hydrocodone-Acetaminophen Tab 5-325 MG: ORAL | Qty: 6 | Status: AC

## 2017-11-29 ENCOUNTER — Telehealth: Payer: Self-pay | Admitting: Adult Health

## 2017-11-29 NOTE — Telephone Encounter (Signed)
Has appt Friday ,but has noticed bulge in vagina to come in am to assess

## 2017-11-30 ENCOUNTER — Other Ambulatory Visit: Payer: Self-pay

## 2017-11-30 ENCOUNTER — Encounter: Payer: Self-pay | Admitting: Adult Health

## 2017-11-30 ENCOUNTER — Ambulatory Visit: Payer: BLUE CROSS/BLUE SHIELD | Admitting: Adult Health

## 2017-11-30 VITALS — BP 110/72 | HR 75 | Ht 64.0 in | Wt 147.0 lb

## 2017-11-30 DIAGNOSIS — K869 Disease of pancreas, unspecified: Secondary | ICD-10-CM | POA: Diagnosis not present

## 2017-11-30 DIAGNOSIS — N898 Other specified noninflammatory disorders of vagina: Secondary | ICD-10-CM

## 2017-11-30 DIAGNOSIS — M545 Low back pain: Secondary | ICD-10-CM | POA: Diagnosis not present

## 2017-11-30 DIAGNOSIS — R350 Frequency of micturition: Secondary | ICD-10-CM | POA: Diagnosis not present

## 2017-11-30 DIAGNOSIS — N8110 Cystocele, unspecified: Secondary | ICD-10-CM | POA: Diagnosis not present

## 2017-11-30 HISTORY — DX: Disease of pancreas, unspecified: K86.9

## 2017-11-30 LAB — POCT URINALYSIS DIPSTICK
GLUCOSE UA: NEGATIVE
Nitrite, UA: NEGATIVE
Protein, UA: NEGATIVE
RBC UA: NEGATIVE

## 2017-11-30 NOTE — Progress Notes (Signed)
Subjective:     Patient ID: Vanessa Stokes, female   DOB: 01/25/1961, 57 y.o.   MRN: [id number]  HPI Vanessa Stokes is a 57 year old white female, PM in complaining of bulge in vagina and back pain radiating to left side and urinary frequency.She was seen in ER 11/25/17 for abdominal pain and found to have area on pancreas, that is suggested to follow up in 1 year with MRI or CT.Labs in ER normal.   Review of Systems +back pain radiating to left side urinary frequency Reviewed past medical,surgical, social and family history. Reviewed medications and allergies.     Objective:   Physical Exam BP 110/72 (BP Location: Left Arm, Patient Position: Sitting, Cuff Size: Normal)   Pulse 75   Ht 5\' 4"  (1.626 m)   Wt 147 lb (66.7 kg)   LMP 05/05/2015   BMI 25.23 kg/m urine dipstick 1+ leuks. Skin warm and dry.Pelvic: external genitalia is normal in appearance no lesions, vagina: +cystocele and vaginal wall cyst,urethra has no lesions or masses noted, cervix:smooth and bulbous, uterus: slightly irregular in shape, +fibroids, mildly tender, no masses felt, adnexa: no masses or tenderness noted. Bladder is non tender and no masses felt. Discussed may could try pessary, vs surgery. Face time 15 minutes with 50% counseling and reviewing CT.    Assessment:     1. Cystocele without uterine prolapse   2. Vaginal wall cyst   3. Urinary frequency   4. Pancreatic lesion       Plan:     Reviewed medical explainer #4 Keep appt with Dr Elonda Husky Friday to discuss options

## 2017-11-30 NOTE — Patient Instructions (Signed)

## 2017-12-02 ENCOUNTER — Ambulatory Visit: Payer: BLUE CROSS/BLUE SHIELD | Admitting: Obstetrics & Gynecology

## 2017-12-02 ENCOUNTER — Ambulatory Visit: Payer: BLUE CROSS/BLUE SHIELD | Admitting: Adult Health

## 2017-12-02 ENCOUNTER — Encounter: Payer: Self-pay | Admitting: Obstetrics & Gynecology

## 2017-12-02 VITALS — BP 110/70 | HR 95 | Ht 64.0 in | Wt 148.0 lb

## 2017-12-02 DIAGNOSIS — N813 Complete uterovaginal prolapse: Secondary | ICD-10-CM

## 2017-12-02 DIAGNOSIS — M545 Low back pain, unspecified: Secondary | ICD-10-CM

## 2017-12-02 DIAGNOSIS — N3281 Overactive bladder: Secondary | ICD-10-CM | POA: Diagnosis not present

## 2017-12-02 DIAGNOSIS — N952 Postmenopausal atrophic vaginitis: Secondary | ICD-10-CM | POA: Diagnosis not present

## 2017-12-02 DIAGNOSIS — F17210 Nicotine dependence, cigarettes, uncomplicated: Secondary | ICD-10-CM

## 2017-12-02 DIAGNOSIS — G8929 Other chronic pain: Secondary | ICD-10-CM | POA: Diagnosis not present

## 2017-12-02 DIAGNOSIS — F17200 Nicotine dependence, unspecified, uncomplicated: Secondary | ICD-10-CM

## 2017-12-02 DIAGNOSIS — N8110 Cystocele, unspecified: Secondary | ICD-10-CM

## 2017-12-02 DIAGNOSIS — N3941 Urge incontinence: Secondary | ICD-10-CM

## 2017-12-02 DIAGNOSIS — N814 Uterovaginal prolapse, unspecified: Secondary | ICD-10-CM

## 2017-12-02 MED ORDER — ESTROGENS, CONJUGATED 0.625 MG/GM VA CREA: 1.0000 | TOPICAL_CREAM | Freq: Every day | VAGINAL | 11 refills | Status: DC

## 2017-12-02 MED ORDER — OXYBUTYNIN CHLORIDE ER 10 MG PO TB24: 10.0000 mg | ORAL_TABLET | Freq: Every day | ORAL | 11 refills | Status: DC

## 2017-12-02 NOTE — Progress Notes (Signed)
Chief Complaint  Patient presents with  . bladder concerns    vaginal wall cyst/ urinary frequency     HPI:    57 y.o. G2P0011 Patient's last menstrual period was 05/05/2015.   Location: Vaginal. Quality: Moderate pressure. Severity: Moderate. Timing: Constant. Duration: Several months. Context: Worse with lifting pushing pulling. Modifying factors: Worse with lifting pushing pulling Signs/Symptoms: Pressure    Current Outpatient Medications:  .  ALPRAZolam (XANAX) 0.25 MG tablet, Take 0.25 mg by mouth at bedtime as needed and may repeat dose one time if needed for anxiety. , Disp: , Rfl:  .  PARoxetine (PAXIL) 20 MG tablet, Take 20 mg by mouth every morning., Disp: , Rfl:  .  conjugated estrogens (PREMARIN) vaginal cream, Place 1 Applicatorful vaginally daily. At bedtime, Disp: 30 g, Rfl: 11 .  Misc. Devices (RING PESSARY/SUPPORT) MISC, 1 Device by Does not apply route continuous., Disp: 1 each, Rfl: 0 .  oxybutynin (DITROPAN-XL) 10 MG 24 hr tablet, Take 1 tablet (10 mg total) by mouth at bedtime., Disp: 30 tablet, Rfl: 11  Problem Pertinent ROS:     No burning with urination, frequency or urgency No nausea, vomiting or diarrhea Nor fever chills or other constitutional symptoms     Extended ROS:        PMFSH:             Past Medical History:  Diagnosis Date  . 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 Anemia   . Bloated abdomen 05/12/2015  . Depression    Paxil  . Dysmenorrhea 05/12/2015  . Fatigue 05/12/2015  . Fibroid 05/15/2015  . Fibroids 05/19/2015  . Menometrorrhagia 05/12/2015  . Pancreatic lesion 11/30/2017   Density in pancreatic head needs F/U in 1 year   . Postmenopausal 05/15/2015  . Postmenopausal bleeding 05/19/2015  . Vaginal wall cyst 05/12/2015    Past Surgical History:  Procedure Laterality Date  . BRAIN SURGERY    . COLONOSCOPY  12/04/2008   Dr. Rourk:Friable anal canal and anal papilla, otherwise normal rectum/Left-sided diverticula polyp at the splenic  flexure, status post cold snared.  The remainder of the colonic mucosa and terminal  ileum mucosa appeared normal. BENIGN polyp.   . COLONOSCOPY, ESOPHAGOGASTRODUODENOSCOPY (EGD) AND ESOPHAGEAL DILATION N/A 01/28/2014   Procedure: COLONOSCOPY, ESOPHAGOGASTRODUODENOSCOPY (EGD) AND ESOPHAGEAL DILATION (ED);  Surgeon: Daneil Dolin, MD;  Location: [facility];  Service: Endoscopy;  Laterality: N/A;  830  . pituitary gland tumors removed     X 2    OB History    Gravida  2   Para  1   Term      Preterm      AB  1   Living  1     SAB  1   TAB      Ectopic      Multiple      Live Births              No Known Allergies  Social History   Socioeconomic History  . Marital status: Divorced    Spouse name: Not on file  . Number of children: Not on file  . Years of education: Not on file  . Highest education level: Not on file  Occupational History  . 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 Financial resource strain: Not on file  . Food insecurity:    Worry: Not on file    Inability: Not on file  .  Transportation needs:    Medical: Not on file    Non-medical: Not on file  Tobacco Use  . Smoking status: Current Every Day Smoker    Packs/day: 0.25    Types: Cigarettes  . Smokeless tobacco: Never Used  . Tobacco comment: Smokes about 3 cigarettes daily  Substance and Sexual Activity  . Alcohol use: Yes    Alcohol/week: 0.0 oz    Comment: occ beer  . Drug use: No  . Sexual activity: Not Currently    Birth control/protection: None  Lifestyle  . Physical activity:    Days per week: Not on file    Minutes per session: Not on file  . Stress: Not on file  Relationships  . Social connections:    Talks on phone: Not on file    Gets together: Not on file    Attends religious service: Not on file    Active member of club or organization: Not on file    Attends meetings of clubs or organizations: Not on file    Relationship status: Not  on file  Other Topics Concern  . Not on file  Social History Narrative  . Not on file    Family History  Problem Relation Age of Onset  . Cancer Mother        lung  . Cancer Father        lung  . Other Daughter        hip problems; degenerative hip dysplasia  . Colon cancer Neg Hx      Examination:  Vitals:  Blood pressure 110/70, pulse 95, height 5\' 4"  (1.626 m), weight 148 lb (67.1 kg), last menstrual period 05/05/2015.    Physical Examination:     General Appearance:  awake, alert, oriented, in no acute distress  Vulva:  normal appearing vulva with no masses, tenderness or lesions Vagina: Grade 3 cystocele with uterine prolapse as well Cervix:  no cervical motion tenderness and no lesions Uterus:  normal size, contour, position, consistency, mobility, non-tender  Grade 2 relaxation Adnexa: ovaries:present,  normal adnexa in size, nontender and no masses  No results found for this or any previous visit (from the past 72 hour(s)).   DATA orders and reviews: Labs were not ordered today:   Imaging studies were not ordered today:    Lab tests were not reviewed today:    Imaging studies were not reviewed today:    I did not independently review/view images, tracing or specimen(not simply the report) myself.    Prescription Drug Management:   Prescriptions prescribed this encounter:    Meds ordered this encounter  Medications  . oxybutynin (DITROPAN-XL) 10 MG 24 hr tablet    Sig: Take 1 tablet (10 mg total) by mouth at bedtime.    Dispense:  30 tablet    Refill:  11  . conjugated estrogens (PREMARIN) vaginal cream    Sig: Place 1 Applicatorful vaginally daily. At bedtime    Dispense:  30 g    Refill:  11    Renewed Prescriptions:    Current prescription changes:     Impression/Plan(Problem Based):  POP-Q stage 3 cystocele  Cystocele with uterine prolapse  Vaginal atrophy  Smoker  OAB (overactive bladder)  Urge incontinence  Chronic  bilateral low back pain without sciatica  Ditopan XL and premarin vaginal cream  Follow Up:   Return in about 2 months (around 01/30/2018) for Follow up, with Dr Elonda Husky.      All questions were answered.

## 2018-01-16 DIAGNOSIS — R490 Dysphonia: Secondary | ICD-10-CM | POA: Diagnosis not present

## 2018-01-16 DIAGNOSIS — Z720 Tobacco use: Secondary | ICD-10-CM | POA: Diagnosis not present

## 2018-01-16 DIAGNOSIS — E220 Acromegaly and pituitary gigantism: Secondary | ICD-10-CM | POA: Diagnosis not present

## 2018-01-16 DIAGNOSIS — Z8639 Personal history of other endocrine, nutritional and metabolic disease: Secondary | ICD-10-CM | POA: Diagnosis not present

## 2018-01-30 ENCOUNTER — Other Ambulatory Visit: Payer: Self-pay

## 2018-01-30 ENCOUNTER — Encounter: Payer: Self-pay | Admitting: Obstetrics & Gynecology

## 2018-01-30 ENCOUNTER — Ambulatory Visit: Payer: BLUE CROSS/BLUE SHIELD | Admitting: Obstetrics & Gynecology

## 2018-01-30 VITALS — BP 110/72 | HR 79 | Ht 64.0 in | Wt 150.0 lb

## 2018-01-30 DIAGNOSIS — N814 Uterovaginal prolapse, unspecified: Secondary | ICD-10-CM

## 2018-01-30 DIAGNOSIS — N3941 Urge incontinence: Secondary | ICD-10-CM | POA: Diagnosis not present

## 2018-01-30 DIAGNOSIS — N952 Postmenopausal atrophic vaginitis: Secondary | ICD-10-CM | POA: Diagnosis not present

## 2018-01-30 NOTE — Progress Notes (Signed)
Chief Complaint  Patient presents with  . Follow-up      57 y.o. G2P0011 Patient's last menstrual period was 05/05/2015. The current method of family planning is post menopausal status.  Outpatient Encounter Medications as of 01/30/2018  Medication Sig  . ALPRAZolam (XANAX) 0.25 MG tablet Take 0.25 mg by mouth at bedtime as needed and may repeat dose one time if needed for anxiety.   . conjugated estrogens (PREMARIN) vaginal cream Place 1 Applicatorful vaginally daily. At bedtime  . PARoxetine (PAXIL) 20 MG tablet Take 20 mg by mouth every morning.  . Misc. Devices (RING PESSARY/SUPPORT) MISC 1 Device by Does not apply route continuous.  Marland Kitchen oxybutynin (DITROPAN-XL) 10 MG 24 hr tablet Take 1 tablet (10 mg total) by mouth at bedtime. (Patient not taking: Reported on 01/30/2018)   No facility-administered encounter medications on file as of 01/30/2018.     Subjective Vanessa Stokes presents to the office complaining of pelvic pressure and feeling at times like her bladder is falling Her symptoms are vaginal they are moderate in severity The quality of the discomfort is pressure times sharp pain Duration is variable but worse with lifting and pushing and pulling Made better by being off of her feet Past Medical History:  Diagnosis Date  . 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 Anemia   . Bloated abdomen 05/12/2015  . Depression    Paxil  . Dysmenorrhea 05/12/2015  . Fatigue 05/12/2015  . Fibroid 05/15/2015  . Fibroids 05/19/2015  . Menometrorrhagia 05/12/2015  . Pancreatic lesion 11/30/2017   Density in pancreatic head needs F/U in 1 year   . Postmenopausal 05/15/2015  . Postmenopausal bleeding 05/19/2015  . Vaginal wall cyst 05/12/2015    Past Surgical History:  Procedure Laterality Date  . BRAIN SURGERY    . COLONOSCOPY  12/04/2008   Dr. Rourk:Friable anal canal and anal papilla, otherwise normal rectum/Left-sided diverticula polyp at the splenic flexure, status post cold snared.  The  remainder of the colonic mucosa and terminal  ileum mucosa appeared normal. BENIGN polyp.   . COLONOSCOPY, ESOPHAGOGASTRODUODENOSCOPY (EGD) AND ESOPHAGEAL DILATION N/A 01/28/2014   Procedure: COLONOSCOPY, ESOPHAGOGASTRODUODENOSCOPY (EGD) AND ESOPHAGEAL DILATION (ED);  Surgeon: Daneil Dolin, MD;  Location: [facility];  Service: Endoscopy;  Laterality: N/A;  830  . pituitary gland tumors removed     X 2    OB History    Gravida Para Term Preterm AB Living   2 1     1 1    SAB TAB Ectopic Multiple Live Births   1              No Known Allergies  Social History   Socioeconomic History  . Marital status: Divorced    Spouse name: None  . Number of children: None  . Years of education: None  . Highest education level: None  Social Needs  . Financial resource strain: None  . Food insecurity - worry: None  . Food insecurity - inability: None  . Transportation needs - medical: None  . Transportation needs - non-medical: None  Occupational History  . 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 Smoking status: Current Every Day Smoker    Packs/day: 0.25    Types: Cigarettes  . Smokeless tobacco: Never Used  . Tobacco comment: Smokes about 3 cigarettes daily  Substance and Sexual Activity  . Alcohol use: Yes    Alcohol/week: 0.0 oz  Comment: occ beer  . Drug use: No  . Sexual activity: No    Birth control/protection: None  Other Topics Concern  . None  Social History Narrative  . None    Family History  Problem Relation Age of Onset  . Cancer Mother        lung  . Cancer Father        lung  . Other Daughter        hip problems; degenerative hip dysplasia  . Colon cancer Neg Hx     Medications:       Current Outpatient Medications:  .  ALPRAZolam (XANAX) 0.25 MG tablet, Take 0.25 mg by mouth at bedtime as needed and may repeat dose one time if needed for anxiety. , Disp: , Rfl:  .  conjugated estrogens (PREMARIN) vaginal cream, Place 1  Applicatorful vaginally daily. At bedtime, Disp: 30 g, Rfl: 11 .  PARoxetine (PAXIL) 20 MG tablet, Take 20 mg by mouth every morning., Disp: , Rfl:  .  Misc. Devices (RING PESSARY/SUPPORT) MISC, 1 Device by Does not apply route continuous., Disp: 1 each, Rfl: 0 .  oxybutynin (DITROPAN-XL) 10 MG 24 hr tablet, Take 1 tablet (10 mg total) by mouth at bedtime. (Patient not taking: Reported on 01/30/2018), Disp: 30 tablet, Rfl: 11  Objective Blood pressure 110/72, pulse 79, height 5\' 4"  (1.626 m), weight 150 lb (68 kg), last menstrual period 05/05/2015.  General WDWN female NAD Vulva:  normal appearing vulva with no masses, tenderness or lesions Vagina: Patient has a grade 3 cystocele also with uterine prolapse and significant vaginal atrophy Cervix:  no cervical motion tenderness and no lesions Uterus:  normal size, contour, position, consistency, mobility, non-tender Adnexa: ovaries:present,     Pertinent ROS No burning with urination, frequency or urgency No nausea, vomiting or diarrhea Nor fever chills or other constitutional symptoms   Labs or studies none    Impression Diagnoses this Encounter::   ICD-10-CM   1. Cystocele with uterine prolapse N81.4   2. Urge incontinence N39.41   3. Vaginal atrophy N95.2     Established relevant diagnosis(es):   Plan/Recommendations: Meds ordered this encounter  Medications  . Misc. Devices (RING PESSARY/SUPPORT) MISC    Sig: 1 Device by Does not apply route continuous.    Dispense:  1 each    Refill:  0    #3    Labs or Scans Ordered: No orders of the defined types were placed in this encounter.   Management:: milex ring with support #3 fit and ordered Return in 2 weeks for placement  Follow up Return in about 2 weeks (around 02/13/2018) for Follow up, with Dr Akiah Bauch:pessary placement.     All questions were answered.

## 2018-02-01 DIAGNOSIS — Z8639 Personal history of other endocrine, nutritional and metabolic disease: Secondary | ICD-10-CM | POA: Diagnosis not present

## 2018-02-01 DIAGNOSIS — E220 Acromegaly and pituitary gigantism: Secondary | ICD-10-CM | POA: Diagnosis not present

## 2018-02-03 ENCOUNTER — Encounter: Payer: Self-pay | Admitting: Obstetrics & Gynecology

## 2018-02-03 MED ORDER — RING PESSARY/SUPPORT MISC: 1.0000 | 0 refills | Status: DC

## 2018-02-13 ENCOUNTER — Ambulatory Visit: Payer: BLUE CROSS/BLUE SHIELD | Admitting: Obstetrics & Gynecology

## 2018-02-23 DIAGNOSIS — F17200 Nicotine dependence, unspecified, uncomplicated: Secondary | ICD-10-CM | POA: Diagnosis not present

## 2018-02-23 DIAGNOSIS — K219 Gastro-esophageal reflux disease without esophagitis: Secondary | ICD-10-CM | POA: Insufficient documentation

## 2018-02-23 DIAGNOSIS — R490 Dysphonia: Secondary | ICD-10-CM | POA: Diagnosis not present

## 2018-03-02 ENCOUNTER — Other Ambulatory Visit: Payer: Self-pay

## 2018-03-02 ENCOUNTER — Ambulatory Visit: Payer: BLUE CROSS/BLUE SHIELD | Admitting: Obstetrics & Gynecology

## 2018-03-02 ENCOUNTER — Encounter: Payer: Self-pay | Admitting: Obstetrics & Gynecology

## 2018-03-02 VITALS — BP 124/80 | HR 79 | Ht 64.0 in | Wt 150.0 lb

## 2018-03-02 DIAGNOSIS — Z4689 Encounter for fitting and adjustment of other specified devices: Secondary | ICD-10-CM

## 2018-03-02 DIAGNOSIS — N814 Uterovaginal prolapse, unspecified: Secondary | ICD-10-CM | POA: Diagnosis not present

## 2018-03-02 NOTE — Progress Notes (Signed)
Chief Complaint  Patient presents with  . pessary insertion    Blood pressure 124/80, pulse 79, height 5\' 4"  (1.626 m), weight 150 lb (68 kg), last menstrual period 05/05/2015.  Vanessa Stokes presents today for routine follow up related to her pessary.   She uses a milex ring with support #3, placed for the first time today She reports n/a vaginal discharge or vaginal bleeding.  Exam reveals no undue vaginal mucosal pressure of breakdown, no discharge and no vaginal bleeding.  The pessary is removed, cleaned and replaced without difficulty.    Vanessa Stokes will be sen back in 1 months for continued follow up.  Florian Buff, MD  03/02/2018 11:52 AM

## 2018-03-20 ENCOUNTER — Telehealth: Payer: Self-pay | Admitting: Obstetrics & Gynecology

## 2018-03-20 NOTE — Telephone Encounter (Signed)
Informed patient Dr Elonda Husky stated she was fine to put pessary back in but if she was unable to, to give [place] a call for him to do.  Verbalized understanding.

## 2018-04-06 ENCOUNTER — Ambulatory Visit: Payer: BLUE CROSS/BLUE SHIELD | Admitting: Obstetrics & Gynecology

## 2018-08-02 ENCOUNTER — Telehealth: Payer: Self-pay | Admitting: Obstetrics & Gynecology

## 2018-08-02 NOTE — Telephone Encounter (Signed)
Patient states she was unable to come for her follow-up appt but has rescheduled. She has had increased swelling in her abdomen and looks like she is pregnant.  She is having regular BM's.  Appt made for 9/26 and placed on wait list for sooner appt.  Verbalized understanding.

## 2018-08-17 ENCOUNTER — Encounter

## 2018-08-17 ENCOUNTER — Other Ambulatory Visit: Payer: Self-pay

## 2018-08-17 ENCOUNTER — Encounter (INDEPENDENT_AMBULATORY_CARE_PROVIDER_SITE_OTHER): Payer: Self-pay

## 2018-08-17 ENCOUNTER — Encounter: Payer: Self-pay | Admitting: Obstetrics & Gynecology

## 2018-08-17 ENCOUNTER — Ambulatory Visit (INDEPENDENT_AMBULATORY_CARE_PROVIDER_SITE_OTHER): Payer: BLUE CROSS/BLUE SHIELD | Admitting: Obstetrics & Gynecology

## 2018-08-17 VITALS — BP 117/79 | HR 65 | Ht 64.0 in | Wt 154.0 lb

## 2018-08-17 DIAGNOSIS — R1033 Periumbilical pain: Secondary | ICD-10-CM

## 2018-08-17 DIAGNOSIS — R140 Abdominal distension (gaseous): Secondary | ICD-10-CM | POA: Diagnosis not present

## 2018-08-17 DIAGNOSIS — N3281 Overactive bladder: Secondary | ICD-10-CM | POA: Diagnosis not present

## 2018-08-17 NOTE — Progress Notes (Signed)
Chief Complaint  Patient presents with  . abdominal discomfort    leaking urine      57 y.o. G2P0011 Patient's last menstrual period was 05/05/2015. The current method of family planning is post menopausal status.  Outpatient Encounter Medications as of 08/17/2018  Medication Sig  . PARoxetine (PAXIL) 20 MG tablet Take 20 mg by mouth every morning.  Marland Kitchen ALPRAZolam (XANAX) 0.25 MG tablet Take 0.25 mg by mouth at bedtime as needed and may repeat dose one time if needed for anxiety.   . conjugated estrogens (PREMARIN) vaginal cream Place 1 Applicatorful vaginally daily. At bedtime (Patient not taking: Reported on 08/17/2018)  . Misc. Devices (RING PESSARY/SUPPORT) MISC 1 Device by Does not apply route continuous. (Patient not taking: Reported on 08/17/2018)  . oxybutynin (DITROPAN-XL) 10 MG 24 hr tablet Take 1 tablet (10 mg total) by mouth at bedtime. (Patient not taking: Reported on 08/17/2018)   No facility-administered encounter medications on file as of 08/17/2018.     Subjective Had periumbilical discomfort pain with asssoicated bloating for the past month or so No related to postprandiol state fatty meals etc Worse when she reclines going to bed at night No nuasea or GERD or heartburn related symptoms Never had GB attack she knows of Not associated with lifting pushing pulling etc Intensity is mild to moderate  Also with urinary frequency and loss of urine, not taking the ditropan or the premarin vaginal cream, took pessary out, not in since April Past Medical History:  Diagnosis Date  . 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 Anemia   . Bloated abdomen 05/12/2015  . Depression    Paxil  . Dysmenorrhea 05/12/2015  . Fatigue 05/12/2015  . Fibroid 05/15/2015  . Fibroids 05/19/2015  . Menometrorrhagia 05/12/2015  . Pancreatic lesion 11/30/2017   Density in pancreatic head needs F/U in 1 year   . Postmenopausal 05/15/2015  . Postmenopausal bleeding 05/19/2015  . Vaginal wall cyst 05/12/2015     Past Surgical History:  Procedure Laterality Date  . BRAIN SURGERY    . COLONOSCOPY  12/04/2008   Dr. Rourk:Friable anal canal and anal papilla, otherwise normal rectum/Left-sided diverticula polyp at the splenic flexure, status post cold snared.  The remainder of the colonic mucosa and terminal  ileum mucosa appeared normal. BENIGN polyp.   . COLONOSCOPY, ESOPHAGOGASTRODUODENOSCOPY (EGD) AND ESOPHAGEAL DILATION N/A 01/28/2014   Procedure: COLONOSCOPY, ESOPHAGOGASTRODUODENOSCOPY (EGD) AND ESOPHAGEAL DILATION (ED);  Surgeon: Daneil Dolin, MD;  Location: [facility];  Service: Endoscopy;  Laterality: N/A;  830  . pituitary gland tumors removed     X 2    OB History    Gravida  2   Para  1   Term      Preterm      AB  1   Living  1     SAB  1   TAB      Ectopic      Multiple      Live Births              No Known Allergies  Social History   Socioeconomic History  . Marital status: Divorced    Spouse name: Not on file  . Number of children: Not on file  . Years of education: Not on file  . Highest education level: Not on file  Occupational History  . 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 Financial resource strain: Not on  file  . Food insecurity:    Worry: Not on file    Inability: Not on file  . Transportation needs:    Medical: Not on file    Non-medical: Not on file  Tobacco Use  . Smoking status: Current Every Day Smoker    Packs/day: 0.25    Types: Cigarettes  . Smokeless tobacco: Never Used  . Tobacco comment: Smokes about 3 cigarettes daily  Substance and Sexual Activity  . Alcohol use: Yes    Alcohol/week: 0.0 standard drinks    Comment: occ beer  . Drug use: No  . Sexual activity: Not Currently    Birth control/protection: None  Lifestyle  . Physical activity:    Days per week: Not on file    Minutes per session: Not on file  . Stress: Not on file  Relationships  . Social connections:    Talks on  phone: Not on file    Gets together: Not on file    Attends religious service: Not on file    Active member of club or organization: Not on file    Attends meetings of clubs or organizations: Not on file    Relationship status: Not on file  Other Topics Concern  . Not on file  Social History Narrative  . Not on file    Family History  Problem Relation Age of Onset  . Cancer Mother        lung  . Cancer Father        lung  . Other Daughter        hip problems; degenerative hip dysplasia  . Colon cancer Neg Hx     Medications:       Current Outpatient Medications:  .  PARoxetine (PAXIL) 20 MG tablet, Take 20 mg by mouth every morning., Disp: , Rfl:  .  ALPRAZolam (XANAX) 0.25 MG tablet, Take 0.25 mg by mouth at bedtime as needed and may repeat dose one time if needed for anxiety. , Disp: , Rfl:  .  conjugated estrogens (PREMARIN) vaginal cream, Place 1 Applicatorful vaginally daily. At bedtime (Patient not taking: Reported on 08/17/2018), Disp: 30 g, Rfl: 11 .  Misc. Devices (RING PESSARY/SUPPORT) MISC, 1 Device by Does not apply route continuous. (Patient not taking: Reported on 08/17/2018), Disp: 1 each, Rfl: 0 .  oxybutynin (DITROPAN-XL) 10 MG 24 hr tablet, Take 1 tablet (10 mg total) by mouth at bedtime. (Patient not taking: Reported on 08/17/2018), Disp: 30 tablet, Rfl: 11  Objective Blood pressure 117/79, pulse 65, height 5\' 4"  (1.626 m), weight 154 lb (69.9 kg), last menstrual period 05/05/2015.  General WDWN female NAD Abdomen soft not distended non tender on exam no rebound completely benign  Pertinent ROS No burning with urination, frequency or urgency No nausea, vomiting or diarrhea Nor fever chills or other constitutional symptoms   Labs or studies No new    Impression Diagnoses this Encounter::   ICD-10-CM   1. Periumbilical abdominal pain R10.33   2. Bloating R14.0   3. OAB (overactive bladder) N32.81     Established relevant  diagnosis(es):   Plan/Recommendations: No orders of the defined types were placed in this encounter.   Labs or Scans Ordered: No orders of the defined types were placed in this encounter.   Management:: >already has an appointment next week with her PCP, recommend possible GB eval  >her symptoms are not consistent with her pelvic prolapse as her abdominal discomfort and bloating is worse with reclining  >  as far as her bladder symptoms, recommend restart her ditropan and premarin that has previously been prescribed, consider coming back in for larger pessary if it is coming out, she ill conider  Follow up Return if symptoms worsen or fail to improve, for return if wants to be refit for a size larger pessary.      All questions were answered.

## 2018-09-13 DIAGNOSIS — D497 Neoplasm of unspecified behavior of endocrine glands and other parts of nervous system: Secondary | ICD-10-CM | POA: Diagnosis not present

## 2018-09-13 DIAGNOSIS — K8689 Other specified diseases of pancreas: Secondary | ICD-10-CM | POA: Diagnosis not present

## 2018-09-13 DIAGNOSIS — D509 Iron deficiency anemia, unspecified: Secondary | ICD-10-CM | POA: Diagnosis not present

## 2018-09-13 DIAGNOSIS — Z1389 Encounter for screening for other disorder: Secondary | ICD-10-CM | POA: Diagnosis not present

## 2018-09-13 DIAGNOSIS — Z719 Counseling, unspecified: Secondary | ICD-10-CM | POA: Diagnosis not present

## 2018-09-13 DIAGNOSIS — F329 Major depressive disorder, single episode, unspecified: Secondary | ICD-10-CM | POA: Diagnosis not present

## 2018-09-13 DIAGNOSIS — Z6826 Body mass index (BMI) 26.0-26.9, adult: Secondary | ICD-10-CM | POA: Diagnosis not present

## 2018-09-13 DIAGNOSIS — Z0001 Encounter for general adult medical examination with abnormal findings: Secondary | ICD-10-CM | POA: Diagnosis not present

## 2018-09-13 DIAGNOSIS — F419 Anxiety disorder, unspecified: Secondary | ICD-10-CM | POA: Diagnosis not present

## 2018-09-13 DIAGNOSIS — Z23 Encounter for immunization: Secondary | ICD-10-CM | POA: Diagnosis not present

## 2018-10-02 ENCOUNTER — Other Ambulatory Visit (HOSPITAL_COMMUNITY): Payer: Self-pay | Admitting: Family Medicine

## 2018-10-02 DIAGNOSIS — K8689 Other specified diseases of pancreas: Secondary | ICD-10-CM

## 2018-10-03 ENCOUNTER — Other Ambulatory Visit (HOSPITAL_COMMUNITY): Payer: Self-pay | Admitting: Family Medicine

## 2018-10-05 ENCOUNTER — Other Ambulatory Visit (HOSPITAL_COMMUNITY): Payer: Self-pay | Admitting: Family Medicine

## 2018-10-05 ENCOUNTER — Ambulatory Visit (HOSPITAL_COMMUNITY)
Admission: RE | Admit: 2018-10-05 | Discharge: 2018-10-05 | Disposition: A | Discharge status: Home / Self Care | Payer: BLUE CROSS/BLUE SHIELD | Source: Ambulatory Visit | Attending: Family Medicine | Admitting: Family Medicine

## 2018-10-05 DIAGNOSIS — K8689 Other specified diseases of pancreas: Secondary | ICD-10-CM

## 2018-10-05 DIAGNOSIS — K862 Cyst of pancreas: Secondary | ICD-10-CM | POA: Insufficient documentation

## 2018-10-05 DIAGNOSIS — R935 Abnormal findings on diagnostic imaging of other abdominal regions, including retroperitoneum: Secondary | ICD-10-CM | POA: Diagnosis not present

## 2018-10-05 DIAGNOSIS — K869 Disease of pancreas, unspecified: Secondary | ICD-10-CM | POA: Diagnosis not present

## 2018-10-05 MED ORDER — GADOBUTROL 1 MMOL/ML IV SOLN
7.0000 mL | Freq: Once | INTRAVENOUS | Status: AC | PRN
  Administered 2018-10-05: 7 mL via INTRAVENOUS

## 2018-10-13 ENCOUNTER — Other Ambulatory Visit (HOSPITAL_COMMUNITY): Payer: Self-pay | Admitting: Family Medicine

## 2018-10-13 DIAGNOSIS — Z1231 Encounter for screening mammogram for malignant neoplasm of breast: Secondary | ICD-10-CM

## 2018-11-01 ENCOUNTER — Encounter (HOSPITAL_COMMUNITY): Payer: Self-pay

## 2018-11-01 ENCOUNTER — Ambulatory Visit (HOSPITAL_COMMUNITY)
Admission: RE | Admit: 2018-11-01 | Discharge: 2018-11-01 | Disposition: A | Discharge status: Home / Self Care | Payer: BLUE CROSS/BLUE SHIELD | Source: Ambulatory Visit | Attending: Family Medicine | Admitting: Family Medicine

## 2018-11-01 DIAGNOSIS — Z1231 Encounter for screening mammogram for malignant neoplasm of breast: Secondary | ICD-10-CM | POA: Diagnosis not present

## 2018-11-03 DIAGNOSIS — R1011 Right upper quadrant pain: Secondary | ICD-10-CM | POA: Diagnosis not present

## 2018-11-03 DIAGNOSIS — N12 Tubulo-interstitial nephritis, not specified as acute or chronic: Secondary | ICD-10-CM | POA: Diagnosis not present

## 2018-11-03 DIAGNOSIS — N3000 Acute cystitis without hematuria: Secondary | ICD-10-CM | POA: Diagnosis not present

## 2018-11-03 DIAGNOSIS — N390 Urinary tract infection, site not specified: Secondary | ICD-10-CM | POA: Diagnosis not present

## 2018-11-07 ENCOUNTER — Encounter: Payer: Self-pay | Admitting: Internal Medicine

## 2019-09-05 DIAGNOSIS — Z6825 Body mass index (BMI) 25.0-25.9, adult: Secondary | ICD-10-CM | POA: Diagnosis not present

## 2019-09-05 DIAGNOSIS — K8689 Other specified diseases of pancreas: Secondary | ICD-10-CM | POA: Diagnosis not present

## 2019-09-05 DIAGNOSIS — F419 Anxiety disorder, unspecified: Secondary | ICD-10-CM | POA: Diagnosis not present

## 2019-09-05 DIAGNOSIS — Z1389 Encounter for screening for other disorder: Secondary | ICD-10-CM | POA: Diagnosis not present

## 2019-09-05 DIAGNOSIS — E663 Overweight: Secondary | ICD-10-CM | POA: Diagnosis not present

## 2019-09-05 DIAGNOSIS — Z23 Encounter for immunization: Secondary | ICD-10-CM | POA: Diagnosis not present

## 2019-09-05 DIAGNOSIS — F321 Major depressive disorder, single episode, moderate: Secondary | ICD-10-CM | POA: Diagnosis not present

## 2019-09-06 ENCOUNTER — Other Ambulatory Visit (HOSPITAL_COMMUNITY): Payer: Self-pay | Admitting: Physician Assistant

## 2019-09-06 ENCOUNTER — Other Ambulatory Visit: Payer: Self-pay | Admitting: Physician Assistant

## 2019-09-06 DIAGNOSIS — K8689 Other specified diseases of pancreas: Secondary | ICD-10-CM

## 2019-09-12 ENCOUNTER — Ambulatory Visit (HOSPITAL_COMMUNITY)
Admission: RE | Admit: 2019-09-12 | Discharge: 2019-09-12 | Disposition: A | Discharge status: Home / Self Care | Payer: BC Managed Care – PPO | Source: Ambulatory Visit | Attending: Physician Assistant | Admitting: Physician Assistant

## 2019-09-12 ENCOUNTER — Other Ambulatory Visit: Payer: Self-pay

## 2019-09-12 ENCOUNTER — Other Ambulatory Visit (HOSPITAL_COMMUNITY): Payer: Self-pay | Admitting: Physician Assistant

## 2019-09-12 DIAGNOSIS — K862 Cyst of pancreas: Secondary | ICD-10-CM | POA: Diagnosis not present

## 2019-09-12 DIAGNOSIS — K8689 Other specified diseases of pancreas: Secondary | ICD-10-CM

## 2019-09-12 MED ORDER — GADOBUTROL 1 MMOL/ML IV SOLN
7.0000 mL | Freq: Once | INTRAVENOUS | Status: AC | PRN
  Administered 2019-09-12: 7 mL via INTRAVENOUS

## 2019-09-19 ENCOUNTER — Encounter: Payer: Self-pay | Admitting: Internal Medicine

## 2019-09-28 ENCOUNTER — Ambulatory Visit: Payer: BC Managed Care – PPO | Admitting: Gastroenterology

## 2019-10-03 ENCOUNTER — Ambulatory Visit (INDEPENDENT_AMBULATORY_CARE_PROVIDER_SITE_OTHER): Payer: BC Managed Care – PPO | Admitting: Gastroenterology

## 2019-10-03 ENCOUNTER — Other Ambulatory Visit: Payer: Self-pay

## 2019-10-03 ENCOUNTER — Encounter: Payer: Self-pay | Admitting: Gastroenterology

## 2019-10-03 VITALS — BP 115/77 | HR 70 | Temp 97.8°F | Ht 64.0 in | Wt 142.4 lb

## 2019-10-03 DIAGNOSIS — K2270 Barrett's esophagus without dysplasia: Secondary | ICD-10-CM | POA: Diagnosis not present

## 2019-10-03 DIAGNOSIS — R198 Other specified symptoms and signs involving the digestive system and abdomen: Secondary | ICD-10-CM

## 2019-10-03 DIAGNOSIS — R748 Abnormal levels of other serum enzymes: Secondary | ICD-10-CM | POA: Diagnosis not present

## 2019-10-03 DIAGNOSIS — K869 Disease of pancreas, unspecified: Secondary | ICD-10-CM

## 2019-10-03 NOTE — Patient Instructions (Signed)
1. Please follow up with your gynecologist for your routine exam. The fullness in your pelvic area may be due to a gyn problem. If exam unremarkable, we can pursue CT scan of your pelvis for further evaluation.  2. Upper endoscopy as scheduled. See separate instructions.  3. I will discuss findings of abnormal pancreas labs and cyst with our colleague in [place] to determine if you need a special ultrasound from inside your stomach to look at cyst more closely. We will be in touch.

## 2019-10-03 NOTE — Progress Notes (Signed)
Primary Care Physician:  [facility]  Primary Gastroenterologist:  Garfield Cornea, MD   Chief Complaint  Patient presents with  . abnormal labs    HPI:  Vanessa Stokes is a 58 y.o. female here at the request of Collene Mares PA-C with [facility] for further evaluation of pancreatic lesion and elevated pancreatic enzymes.   Patient had a CT with contrast in 11/2017 for abdominal pain. She was incidentally found to have tiney 51m hypodensity in the pancreatic head. MRI/MRCP vs pancreatic protocol CT recommended in one year for follow up.  Other pertinent findings at that time with right lower quadrant stranding and inflammation adjacent to the appendix.  Periappendiceal low-density structure has appearance of an inflamed fat lobule.  Epiploic appendagitis of the appendix is favored.  Appendiceal diverticulitis also considered but felt less likely.  Appearance not classic for acute appendicitis.  Globular heterogeneous uterus likely secondary to fibroids.  Patient had MRI abdomen/MRCP November 2019 showing stable 5 mm cystic focus in the pancreatic uncinate process.  Recommended 1 year follow-up MRI.  MRI abdomen October 2020 with unchanged 5 mm cyst in the pancreatic uncinate process, suspect to be benign.  Recommendations are for annual follow-up for a minimal of 5 years, with additional follow-up at 7 and 9 years, to document stability.  Patient's labs in October with mildly elevated amylase and lipase.  No history of pancreatitis.  Heavy alcohol use in her teens and 250s  Rare use since then.  She has some heartburn and indigestion, may use something over-the-counter as needed.  No dysphagia.  Occasional ibuprofen PM or Tylenol PM for sleep.  States she has had some fullness and discomfort in the pelvic region chronically.  In the past she reports that she was told might be due to an ovarian cyst.  Due for 1 year follow-up with GYN.  She has a history of  uterine fibroids.  Dysuria.  Current Outpatient Medications  Medication Sig Dispense Refill  . ALPRAZolam (XANAX) 0.25 MG tablet Take 0.25 mg by mouth at bedtime as needed and may repeat dose one time if needed for anxiety.     .Marland KitchenPARoxetine (PAXIL) 20 MG tablet Take 20 mg by mouth every morning.     No current facility-administered medications for this visit.     Allergies as of 10/03/2019  . (No Known Allergies)    Past Medical History:  Diagnosis Date  . [place]romeg[place]   . Anemia   . Bloated abdomen 05/12/2015  . Depression    Paxil  . Dysmenorrhea 05/12/2015  . Fatigue 05/12/2015  . Fibroid 05/15/2015  . Fibroids 05/19/2015  . Menometrorrhagia 05/12/2015  . Pancreatic lesion 11/30/2017   Density in pancreatic head needs F/U in 1 year   . Postmenopausal 05/15/2015  . Postmenopausal bleeding 05/19/2015  . Vaginal wall cyst 05/12/2015    Past Surgical History:  Procedure Laterality Date  . BRAIN SURGERY    . COLONOSCOPY  12/04/2008   Dr. Rourk:Friable anal canal and anal papilla, otherwise normal rectum/Left-sided diverticula polyp at the splenic flexure, status post cold snared.  The remainder of the colonic mucosa and terminal  ileum mucosa appeared normal. BENIGN polyp.   . COLONOSCOPY, ESOPHAGOGASTRODUODENOSCOPY (EGD) AND ESOPHAGEAL DILATION N/A 01/28/2014   Dr. RGala Romney Erosive reflux esophagitis, biopsy-proven short segment Barrett's esophagus with no dysplasia, status post Maloney dilation, hernia, gastric erosions with benign biopsy/no H. pylori, benign small bowel biopsies.  Normal ileocolonoscopy.  Plans to repeat EGD in  1 year and colonoscopy in 10 years.  . pituitary gland tumors removed     X 2    Family History  Problem Relation Age of Onset  . Cancer Mother        lung  . Cancer Father        lung  . Other Daughter        hip problems; degenerative hip dysplasia  . Colon cancer Neg Hx   . Pancreatic disease Neg Hx     Social History   Socioeconomic History   . Marital status: Divorced    Spouse name: Not on file  . Number of children: Not on file  . Years of education: Not on file  . Highest education level: Not on file  Occupational History  . 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 Financial resource strain: Not on file  . Food insecurity    Worry: Not on file    Inability: Not on file  . Transportation needs    Medical: Not on file    Non-medical: Not on file  Tobacco Use  . Smoking status: Current Every Day Smoker    Packs/day: 0.50    Types: Cigarettes  . Smokeless tobacco: Never Used  . Tobacco comment: Smokes about 3 cigarettes daily  Substance and Sexual Activity  . Alcohol use: Yes    Alcohol/week: 0.0 standard drinks    Comment: drank heavily from age [age]. rare etoh since then  . Drug use: No  . Sexual activity: Not Currently    Birth control/protection: None  Lifestyle  . Physical activity    Days per week: Not on file    Minutes per session: Not on file  . Stress: Not on file  Relationships  . Social Herbalist on phone: Not on file    Gets together: Not on file    Attends religious service: Not on file    Active member of club or organization: Not on file    Attends meetings of clubs or organizations: Not on file    Relationship status: Not on file  . Intimate partner violence    Fear of current or ex partner: Not on file    Emotionally abused: Not on file    Physically abused: Not on file    Forced sexual activity: Not on file  Other Topics Concern  . Not on file  Social History Narrative  . Not on file      ROS:  General: Negative for anorexia, weight loss, fever, chills, fatigue, weakness. Eyes: Negative for vision changes.  ENT: Negative for hoarseness, difficulty swallowing , nasal congestion. CV: Negative for chest pain, angina, palpitations, dyspnea on exertion, peripheral edema.  Respiratory: Negative for dyspnea at rest, dyspnea on exertion, cough,  sputum, wheezing.  GI: See history of present illness. GU:  Negative for dysuria, hematuria, urinary incontinence, urinary frequency, nocturnal urination.  MS: Negative for joint pain, low back pain.  Derm: Negative for rash or itching.  Neuro: Negative for weakness, abnormal sensation, seizure, frequent headaches, memory loss, confusion.  Psych: Negative for anxiety, depression, suicidal ideation, hallucinations.  Endo: Negative for unusual weight change.  Heme: Negative for bruising or bleeding. Allergy: Negative for rash or hives.    Physical Examination:  BP 115/77   Pulse 70   Temp 97.8 F (36.6 C) (Oral)   Ht 5' 4"  (1.626 m)   Wt 142 lb 6.4 oz (64.6 kg)  LMP 05/05/2015   BMI 24.44 kg/m    General: Well-nourished, well-developed in no acute distress.  Head: Normocephalic, atraumatic.   Eyes: Conjunctiva pink, no icterus. Mouth: Oropharyngeal mucosa moist and pink , no lesions erythema or exudate. Neck: Supple without thyromegaly, masses, or lymphadenopathy.  Lungs: Clear to auscultation bilaterally.  Heart: Regular rate and rhythm, no murmurs rubs or gallops.  Abdomen: Bowel sounds are normal, nondistended, no hepatosplenomegaly or masses, no abdominal bruits or    hernia , no rebound or guarding.  Mild fullness in the suprapubic region and left of midline, no discrete mass. Rectal: Not performed Extremities: No lower extremity edema. No clubbing or deformities.  Neuro: Alert and oriented x 4 , grossly normal neurologically.  Skin: Warm and dry, no rash or jaundice.   Psych: Alert and cooperative, normal mood and affect.  Labs: Labs dated September 05, 2019: Amylase 139, white blood cell count 2400, hemoglobin 13.1, platelets 187,000, glucose 86, BUN 11, creatinine 0.7, sodium 140, albumin 4.4, total bilirubin 0.4, alk phos 113, AST 22, ALT 18, lipase 79 slightly elevated (reference range 14-72)  Imaging Studies: Mr Abdomen Wwo Contrast  Result Date:  09/12/2019 CLINICAL DATA:  Follow-up pancreatic lesion EXAM: MRI ABDOMEN WITHOUT AND WITH CONTRAST TECHNIQUE: Multiplanar multisequence MR imaging of the abdomen was performed both before and after the administration of intravenous contrast. CONTRAST:  28m GADAVIST GADOBUTROL 1 MMOL/ML IV SOLN COMPARISON:  MRI abdomen dated 10/05/2018 FINDINGS: Lower chest: Lung bases are clear. Hepatobiliary: 5 mm hepatic cyst in segment 2 (series 4/image 19). No suspicious hepatic lesions. Gallbladder is unremarkable. No intrahepatic or extrahepatic ductal dilatation. Pancreas: 5 mm cyst in the uncinate process (series 4/image 36), unchanged. No pancreatic atrophy or ductal dilatation. Spleen:  Within normal limits. Adrenals/Urinary Tract:  Adrenal glands are within normal limits. Kidneys are within normal limits.  No hydronephrosis. Stomach/Bowel: Stomach is within normal limits. Visualized bowel is unremarkable. Vascular/Lymphatic:  No evidence of abdominal aortic aneurysm. No suspicious abdominal lymphadenopathy. Other:  No abdominal ascites. Musculoskeletal: No focal osseous lesions. IMPRESSION: 5 mm cyst in the pancreatic uncinate process, almost certainly benign, unchanged. 1 year stability has been demonstrated. Continued annual follow-up is suggested for a minimum of 5 years, with additional follow-up at 7 and 9 years, to document stability. As such, follow-up CT or MRI abdomen with/without contrast is suggested in 12 months. This recommendation follows ACR consensus guidelines: Management of Incidental Pancreatic Cysts: A White Paper of the ACR Incidental Findings Committee. [id number];[id number] Electronically Signed   By: SJulian HyM.D.   On: 09/12/2019 16:57   Mr 3d Recon At Scanner  Result Date: 09/12/2019 CLINICAL DATA:  Follow-up pancreatic lesion EXAM: MRI ABDOMEN WITHOUT AND WITH CONTRAST TECHNIQUE: Multiplanar multisequence MR imaging of the abdomen was performed both before and after  the administration of intravenous contrast. CONTRAST:  74mGADAVIST GADOBUTROL 1 MMOL/ML IV SOLN COMPARISON:  MRI abdomen dated 10/05/2018 FINDINGS: Lower chest: Lung bases are clear. Hepatobiliary: 5 mm hepatic cyst in segment 2 (series 4/image 19). No suspicious hepatic lesions. Gallbladder is unremarkable. No intrahepatic or extrahepatic ductal dilatation. Pancreas: 5 mm cyst in the uncinate process (series 4/image 36), unchanged. No pancreatic atrophy or ductal dilatation. Spleen:  Within normal limits. Adrenals/Urinary Tract:  Adrenal glands are within normal limits. Kidneys are within normal limits.  No hydronephrosis. Stomach/Bowel: Stomach is within normal limits. Visualized bowel is unremarkable. Vascular/Lymphatic:  No evidence of abdominal aortic aneurysm. No suspicious abdominal lymphadenopathy. Other:  No  abdominal ascites. Musculoskeletal: No focal osseous lesions. IMPRESSION: 5 mm cyst in the pancreatic uncinate process, almost certainly benign, unchanged. 1 year stability has been demonstrated. Continued annual follow-up is suggested for a minimum of 5 years, with additional follow-up at 7 and 9 years, to document stability. As such, follow-up CT or MRI abdomen with/without contrast is suggested in 12 months. This recommendation follows ACR consensus guidelines: Management of Incidental Pancreatic Cysts: A White Paper of the ACR Incidental Findings Committee. Dillard [id number];53:005-110. Electronically Signed   By: Julian Hy M.D.   On: 09/12/2019 16:57

## 2019-10-04 ENCOUNTER — Encounter: Payer: Self-pay | Admitting: Gastroenterology

## 2019-10-04 NOTE — Assessment & Plan Note (Signed)
22mm cyst in pancreatic uncinate process, stable over the last one year. She is asymptomatic. No prior h/o pancreatitis. She has mildly elevated amylase/lipase. Suspect this lesion can be followed via MRI as per radiologist's recommendations but will ask our colleague Dr. Ardis Hughs about possible utility of EUS.

## 2019-10-04 NOTE — Progress Notes (Signed)
cc'ed to pcp °

## 2019-10-04 NOTE — Assessment & Plan Note (Addendum)
Overdue for surveillance EGD. Did well with conscious sedation in the past.  I have discussed the risks, alternatives, benefits with regards to but not limited to the risk of reaction to medication, bleeding, infection, perforation and the patient is agreeable to proceed. Written consent to be obtained.  Patient somewhat apprehensive about restarting PPI. She wants to wait for EGD findings. I explained to her that since she has Barrett's we will advise PPI daily but can wait to EGD as requested.

## 2019-10-04 NOTE — Assessment & Plan Note (Signed)
Fullness in suprapubic region and slightly left of midline in setting of known uterine prolapse and uterine fibroids. Would encourage gyn follow up for possible ultrasound. If unremarkable, could offer CT imaging. Unfortunately the area of concern was not imaged on recent MRI abdomen. Patient voiced understanding.

## 2019-11-19 ENCOUNTER — Telehealth: Payer: Self-pay | Admitting: Gastroenterology

## 2019-11-19 NOTE — Telephone Encounter (Signed)
Please let patient know I discussed pancreatic lesion with Dr. Ardis Hughs and surveillance via imaging recommended. No need for EUS at this time.   Please NIC for MRI abd with and without contrast in 08/2020 for pancreatic cyst.

## 2019-11-20 NOTE — Telephone Encounter (Signed)
PATIENT ON RECALL  °

## 2019-11-20 NOTE — Telephone Encounter (Signed)
Left a detailed message for pt. Please NIC for MRI abd with and without contrast 08/2020 for pancreatic cyst per [facility].

## 2019-12-05 ENCOUNTER — Other Ambulatory Visit (HOSPITAL_COMMUNITY)
Admission: RE | Admit: 2019-12-05 | Discharge: 2019-12-05 | Disposition: A | Discharge status: Home / Self Care | Payer: BC Managed Care – PPO | Source: Ambulatory Visit | Attending: Internal Medicine | Admitting: Internal Medicine

## 2019-12-05 ENCOUNTER — Other Ambulatory Visit: Payer: Self-pay

## 2019-12-05 DIAGNOSIS — Z01812 Encounter for preprocedural laboratory examination: Secondary | ICD-10-CM | POA: Diagnosis not present

## 2019-12-05 DIAGNOSIS — Z20822 Contact with and (suspected) exposure to covid-19: Secondary | ICD-10-CM | POA: Diagnosis not present

## 2019-12-05 LAB — SARS CORONAVIRUS 2 (TAT 6-24 HRS): SARS Coronavirus 2: NEGATIVE

## 2019-12-07 ENCOUNTER — Ambulatory Visit (HOSPITAL_COMMUNITY)
Admission: RE | Admit: 2019-12-07 | Discharge: 2019-12-07 | Disposition: A | Discharge status: Home / Self Care | Payer: BC Managed Care – PPO | Attending: Internal Medicine | Admitting: Internal Medicine

## 2019-12-07 ENCOUNTER — Encounter (HOSPITAL_COMMUNITY)
Admission: RE | Disposition: A | Discharge status: Home / Self Care | Payer: Self-pay | Source: Home / Self Care | Attending: Internal Medicine

## 2019-12-07 ENCOUNTER — Encounter: Payer: Self-pay | Admitting: *Deleted

## 2019-12-07 DIAGNOSIS — F329 Major depressive disorder, single episode, unspecified: Secondary | ICD-10-CM | POA: Insufficient documentation

## 2019-12-07 DIAGNOSIS — Z79899 Other long term (current) drug therapy: Secondary | ICD-10-CM | POA: Diagnosis not present

## 2019-12-07 DIAGNOSIS — Z09 Encounter for follow-up examination after completed treatment for conditions other than malignant neoplasm: Secondary | ICD-10-CM | POA: Insufficient documentation

## 2019-12-07 DIAGNOSIS — K449 Diaphragmatic hernia without obstruction or gangrene: Secondary | ICD-10-CM | POA: Insufficient documentation

## 2019-12-07 DIAGNOSIS — K222 Esophageal obstruction: Secondary | ICD-10-CM | POA: Insufficient documentation

## 2019-12-07 DIAGNOSIS — F17210 Nicotine dependence, cigarettes, uncomplicated: Secondary | ICD-10-CM | POA: Insufficient documentation

## 2019-12-07 DIAGNOSIS — K228 Other specified diseases of esophagus: Secondary | ICD-10-CM | POA: Diagnosis not present

## 2019-12-07 DIAGNOSIS — K2270 Barrett's esophagus without dysplasia: Secondary | ICD-10-CM | POA: Diagnosis not present

## 2019-12-07 HISTORY — PX: BIOPSY: SHX5522

## 2019-12-07 HISTORY — PX: ESOPHAGOGASTRODUODENOSCOPY: SHX5428

## 2019-12-07 SURGERY — EGD (ESOPHAGOGASTRODUODENOSCOPY)
Anesthesia: Moderate Sedation

## 2019-12-07 MED ORDER — MEPERIDINE HCL 100 MG/ML IJ SOLN
INTRAMUSCULAR | Status: DC | PRN
  Administered 2019-12-07: 25 mg via INTRAVENOUS

## 2019-12-07 MED ORDER — STERILE WATER FOR IRRIGATION IR SOLN
Status: DC | PRN
  Administered 2019-12-07: 08:00:00 1.5 mL

## 2019-12-07 MED ORDER — ONDANSETRON HCL 4 MG/2ML IJ SOLN
INTRAMUSCULAR | Status: DC | PRN
  Administered 2019-12-07: 4 mg via INTRAVENOUS

## 2019-12-07 MED ORDER — ONDANSETRON HCL 4 MG/2ML IJ SOLN
INTRAMUSCULAR | Status: AC
  Filled 2019-12-07: qty 2

## 2019-12-07 MED ORDER — MEPERIDINE HCL 50 MG/ML IJ SOLN
INTRAMUSCULAR | Status: AC
  Filled 2019-12-07: qty 1

## 2019-12-07 MED ORDER — LIDOCAINE VISCOUS HCL 2 % MT SOLN
OROMUCOSAL | Status: AC
  Filled 2019-12-07: qty 15

## 2019-12-07 MED ORDER — MIDAZOLAM HCL 5 MG/5ML IJ SOLN
INTRAMUSCULAR | Status: DC | PRN
  Administered 2019-12-07 (×2): 1 mg via INTRAVENOUS
  Administered 2019-12-07 (×2): 2 mg via INTRAVENOUS

## 2019-12-07 MED ORDER — LIDOCAINE VISCOUS HCL 2 % MT SOLN
OROMUCOSAL | Status: DC | PRN
  Administered 2019-12-07: 1 via OROMUCOSAL

## 2019-12-07 MED ORDER — SODIUM CHLORIDE 0.9 % IV SOLN: INTRAVENOUS | Status: DC

## 2019-12-07 MED ORDER — MIDAZOLAM HCL 5 MG/5ML IJ SOLN
INTRAMUSCULAR | Status: AC
  Filled 2019-12-07: qty 10

## 2019-12-07 NOTE — H&P (Signed)
@LOGO @   Primary Care Physician:  [facility] Primary Gastroenterologist:  Dr. Gala Romney  Pre-Procedure History & Physical: HPI:  Vanessa Stokes is a 59 y.o. female here for surveillance EGD for Barrett's esophagus.  Patient denies dysphagia.  Past Medical History:  Diagnosis Date  . 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 Anemia   . Bloated abdomen 05/12/2015  . Depression    Paxil  . Dysmenorrhea 05/12/2015  . Fatigue 05/12/2015  . Fibroid 05/15/2015  . Fibroids 05/19/2015  . Menometrorrhagia 05/12/2015  . Pancreatic lesion 11/30/2017   Density in pancreatic head needs F/U in 1 year   . Postmenopausal 05/15/2015  . Postmenopausal bleeding 05/19/2015  . Vaginal wall cyst 05/12/2015    Past Surgical History:  Procedure Laterality Date  . BRAIN SURGERY    . COLONOSCOPY  12/04/2008   Dr. Jeraline Marcinek:Friable anal canal and anal papilla, otherwise normal rectum/Left-sided diverticula polyp at the splenic flexure, status post cold snared.  The remainder of the colonic mucosa and terminal  ileum mucosa appeared normal. BENIGN polyp.   . COLONOSCOPY, ESOPHAGOGASTRODUODENOSCOPY (EGD) AND ESOPHAGEAL DILATION N/A 01/28/2014   Dr. Gala Romney: Erosive reflux esophagitis, biopsy-proven short segment Barrett's esophagus with no dysplasia, status post Maloney dilation, hernia, gastric erosions with benign biopsy/no H. pylori, benign small bowel biopsies.  Normal ileocolonoscopy.  Plans to repeat EGD in 1 year and colonoscopy in 10 years.  . pituitary gland tumors removed     X 2    Prior to Admission medications   Medication Sig Start Date End Date Taking? Authorizing Provider  ALPRAZolam Duanne Moron) 0.5 MG tablet Take 0.5 mg by mouth daily as needed for anxiety.   Yes [provider]  diphenhydramine-acetaminophen (TYLENOL PM) 25-500 MG TABS tablet Take 1 tablet by mouth at bedtime as needed (sleep).   Yes [provider]  PARoxetine (PAXIL) 20 MG tablet Take 20 mg by mouth every morning.   Yes  [provider]    Allergies as of 10/03/2019  . (No Known Allergies)    Family History  Problem Relation Age of Onset  . Cancer Mother        lung  . Cancer Father        lung  . Other Daughter        hip problems; degenerative hip dysplasia  . Colon cancer Neg Hx   . Pancreatic disease Neg Hx     Social History   Socioeconomic History  . Marital status: Divorced    Spouse name: Not on file  . Number of children: Not on file  . Years of education: Not on file  . Highest education level: Not on file  Occupational History  . 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 Smoking status: Current Every Day Smoker    Packs/day: 0.50    Types: Cigarettes  . Smokeless tobacco: Never Used  . Tobacco comment: Smokes about 3 cigarettes daily  Substance and Sexual Activity  . Alcohol use: Yes    Alcohol/week: 0.0 standard drinks    Comment: drank heavily from age [age]. rare etoh since then  . Drug use: No  . Sexual activity: Not Currently    Birth control/protection: None  Other Topics Concern  . Not on file  Social History Narrative  . Not on file   Social Determinants of Health   Financial Resource Strain:   . Difficulty of Paying Living Expenses: Not on file  Food Insecurity:   .  Worried About Charity fundraiser in the Last Year: Not on file  . Ran Out of Food in the Last Year: Not on file  Transportation Needs:   . Lack of Transportation (Medical): Not on file  . Lack of Transportation (Non-Medical): Not on file  Physical Activity:   . Days of Exercise per Week: Not on file  . Minutes of Exercise per Session: Not on file  Stress:   . Feeling of Stress : Not on file  Social Connections:   . Frequency of Communication with Friends and Family: Not on file  . Frequency of Social Gatherings with Friends and Family: Not on file  . Attends Religious Services: Not on file  . Active Member of Clubs or Organizations: Not on file  .  Attends Archivist Meetings: Not on file  . Marital Status: Not on file  Intimate Partner Violence:   . Fear of Current or Ex-Partner: Not on file  . Emotionally Abused: Not on file  . Physically Abused: Not on file  . Sexually Abused: Not on file    Review of Systems: See HPI, otherwise negative ROS  Physical Exam: BP 103/65   Pulse 64   Temp (!) 97 F (36.1 C) (Oral)   Resp 18   Ht 5\' 4"  (1.626 m)   Wt 65.8 kg   LMP 05/05/2015   SpO2 100%   BMI 24.89 kg/m  General:   Alert,  Well-developed, well-nourished, pleasant and cooperative in NAD Neck:  Supple; no masses or thyromegaly. No significant cervical adenopathy. Lungs:  Clear throughout to auscultation.   No wheezes, crackles, or rhonchi. No acute distress. Heart:  Regular rate and rhythm; no murmurs, clicks, rubs,  or gallops. Abdomen: Non-distended, normal bowel sounds.  Soft and nontender without appreciable mass or hepatosplenomegaly.  Pulses:  Normal pulses noted. Extremities:  Without clubbing or edema.  Impression/Plan: 59 year old lady with Barrett's esophagus history of erosive reflux esophagitis.  Here for EGD with surveillance biopsies, etc. The risks, benefits, limitations, alternatives and imponderables have been reviewed with the patient. Potential for esophageal dilation, biopsy, etc. have also been reviewed.  Questions have been answered. All parties agreeable.     Notice: This dictation was prepared with Dragon dictation along with smaller phrase technology. Any transcriptional errors that result from this process are unintentional and may not be corrected upon review.

## 2019-12-07 NOTE — Op Note (Signed)
[facility] Patient Name: Vanessa Stokes Procedure Date: 12/07/2019 7:06 AM MRN: [id number] Date of Birth: 1961-01-31 Attending MD: Norvel Richards , MD CSN: [id number] Age: 59 Admit Type: Outpatient Procedure:                Upper GI endoscopy Indications:              Surveillance procedure Providers:                Norvel Richards, MD, Charlsie Quest. Theda Sers RN, RN,                            Aram Candela Referring MD:              Medicines:                Midazolam 6 mg IV, Meperidine 25 mg IV Complications:            No immediate complications. Estimated Blood Loss:     Estimated blood loss was minimal. Procedure:                After obtaining informed consent, the endoscope was                            passed under direct vision. Throughout the                            procedure, the patient's blood pressure, pulse, and                            oxygen saturations were monitored continuously. The                            [id number]) was introduced through the                            mouth, and advanced to the second part of duodenum. Scope In: 7:52:36 AM Scope Out: [id number] AM Total Procedure Duration: 0 hours 5 minutes 10 seconds  Findings:      2 tongues of salmon-colored epithelium coming up about 1-1/2 cm above       the GE junction. Noncritical Schatzki's ring at the GE junction. No       esophagitis seen. No nodularity. Small hiatal hernia otherwise normal       stomach. Patent pylorus. Normal D1 and D2. Biopsies of the       salmon-colored epithelium taken for histologic study Impression:               - Salmon-colored mucosa -distal esophagus                            -consistant with history of biopsy-proven Barrett's                            esophagus?appeared to ba a lesser degree than seen                            at prior EGD. Status post biopsy.                           -  Hiatal hernia. Noncritical Schatzki's ring not                   manipulated. Normal stomach. Normal D1-D2 Moderate Sedation:      Moderate (conscious) sedation was administered by the endoscopy nurse       and supervised by the endoscopist. The following parameters were       monitored: oxygen saturation, heart rate, blood pressure, and response       to care. Total physician intraservice time was 16 minutes. Recommendation:           - Patient has a contact number available for                            emergencies. The signs and symptoms of potential                            delayed complications were discussed with the                            patient. Return to normal activities tomorrow.                            Written discharge instructions were provided to the                            patient.                           - Advance diet as tolerated.                           - Continue present medications. Patient should be                            on long-term acid suppression therapy. We will                            begin Protonix 40 mg once daily.                           - Return to my office in 6 months. Aloe up on                            pathology. Procedure Code(s):        --- Professional ---                           [phone], Esophagogastroduodenoscopy, flexible,                            transoral; diagnostic, including collection of                            specimen(s) by brushing or washing, when performed                            (  separate procedure)                           G0500, Moderate sedation services provided by the                            same physician or other qualified health care                            professional performing a gastrointestinal                            endoscopic service that sedation supports,                            requiring the presence of an independent trained                            observer to assist in the monitoring of the                             patient's level of consciousness and physiological                            status; initial 15 minutes of intra-service time;                            patient age 19 years or older (additional time may                            be reported with [phone], as appropriate) Diagnosis Code(s):        --- Professional ---                           K22.8, Other specified diseases of esophagus CPT copyright 2019 American Medical Association. All rights reserved. The codes documented in this report are preliminary and upon coder review may  be revised to meet current compliance requirements. Cristopher Estimable. Estee Yohe, MD Norvel Richards, MD 12/07/2019 8:25:44 AM This report has been signed electronically. Number of Addenda: 0

## 2019-12-07 NOTE — Discharge Instructions (Signed)
EGD Discharge instructions Please read the instructions outlined below and refer to this sheet in the next few weeks. These discharge instructions provide you with general information on caring for yourself after you leave the hospital. Your doctor may also give you specific instructions. While your treatment has been planned according to the most current medical practices available, unavoidable complications occasionally occur. If you have any problems or questions after discharge, please call your doctor. ACTIVITY  You may resume your regular activity but move at a slower pace for the next 24 hours.   Take frequent rest periods for the next 24 hours.   Walking will help expel (get rid of) the air and reduce the bloated feeling in your abdomen.   No driving for 24 hours (because of the anesthesia (medicine) used during the test).   You may shower.   Do not sign any important legal documents or operate any machinery for 24 hours (because of the anesthesia used during the test).  NUTRITION  Drink plenty of fluids.   You may resume your normal diet.   Begin with a light meal and progress to your normal diet.   Avoid alcoholic beverages for 24 hours or as instructed by your caregiver.  MEDICATIONS  You may resume your normal medications unless your caregiver tells you otherwise.  WHAT YOU CAN EXPECT TODAY  You may experience abdominal discomfort such as a feeling of fullness or "gas" pains.  FOLLOW-UP  Your doctor will discuss the results of your test with you.  SEEK IMMEDIATE MEDICAL ATTENTION IF ANY OF THE FOLLOWING OCCUR:  Excessive nausea (feeling sick to your stomach) and/or vomiting.   Severe abdominal pain and distention (swelling).   Trouble swallowing.   Temperature over 101 F (37.8 C).   Rectal bleeding or vomiting of blood.    Gastroesophageal Reflux Disease, Adult Gastroesophageal reflux (GER) happens when acid from the stomach flows up into the tube that  connects the mouth and the stomach (esophagus). Normally, food travels down the esophagus and stays in the stomach to be digested. With GER, food and stomach acid sometimes move back up into the esophagus. You may have a disease called gastroesophageal reflux disease (GERD) if the reflux:  Happens often.  Causes frequent or very bad symptoms.  Causes problems such as damage to the esophagus. When this happens, the esophagus becomes sore and swollen (inflamed). Over time, GERD can make small holes (ulcers) in the lining of the esophagus. What are the causes? This condition is caused by a problem with the muscle between the esophagus and the stomach. When this muscle is weak or not normal, it does not close properly to keep food and acid from coming back up from the stomach. The muscle can be weak because of:  Tobacco use.  Pregnancy.  Having a certain type of hernia (hiatal hernia).  Alcohol use.  Certain foods and drinks, such as coffee, chocolate, onions, and peppermint. What increases the risk? You are more likely to develop this condition if you:  Are overweight.  Have a disease that affects your connective tissue.  Use NSAID medicines. What are the signs or symptoms? Symptoms of this condition include:  Heartburn.  Difficult or painful swallowing.  The feeling of having a lump in the throat.  A bitter taste in the mouth.  Bad breath.  Having a lot of saliva.  Having an upset or bloated stomach.  Belching.  Chest pain. Different conditions can cause chest pain. Make sure you see  your doctor if you have chest pain.  Shortness of breath or noisy breathing (wheezing).  Ongoing (chronic) cough or a cough at night.  Wearing away of the surface of teeth (tooth enamel).  Weight loss. How is this treated? Treatment will depend on how bad your symptoms are. Your doctor may suggest:  Changes to your diet.  Medicine.  Surgery. Follow these instructions at  home: Eating and drinking   Follow a diet as told by your doctor. You may need to avoid foods and drinks such as: ? Coffee and tea (with or without caffeine). ? Drinks that contain alcohol. ? Energy drinks and sports drinks. ? Bubbly (carbonated) drinks or sodas. ? Chocolate and cocoa. ? Peppermint and mint flavorings. ? Garlic and onions. ? Horseradish. ? Spicy and acidic foods. These include peppers, chili powder, curry powder, vinegar, hot sauces, and BBQ sauce. ? Citrus fruit juices and citrus fruits, such as oranges, lemons, and limes. ? Tomato-based foods. These include red sauce, chili, salsa, and pizza with red sauce. ? Fried and fatty foods. These include donuts, french fries, potato chips, and high-fat dressings. ? High-fat meats. These include hot dogs, rib eye steak, sausage, ham, and bacon. ? High-fat dairy items, such as whole milk, butter, and cream cheese.  Eat small meals often. Avoid eating large meals.  Avoid drinking large amounts of liquid with your meals.  Avoid eating meals during the 2-3 hours before bedtime.  Avoid lying down right after you eat.  Do not exercise right after you eat. Lifestyle   Do not use any products that contain nicotine or tobacco. These include cigarettes, e-cigarettes, and chewing tobacco. If you need help quitting, ask your doctor.  Try to lower your stress. If you need help doing this, ask your doctor.  If you are overweight, lose an amount of weight that is healthy for you. Ask your doctor about a safe weight loss goal. General instructions  Pay attention to any changes in your symptoms.  Take over-the-counter and prescription medicines only as told by your doctor. Do not take aspirin, ibuprofen, or other NSAIDs unless your doctor says it is okay.  Wear loose clothes. Do not wear anything tight around your waist.  Raise (elevate) the head of your bed about 6 inches (15 cm).  Avoid bending over if this makes your  symptoms worse.  Keep all follow-up visits as told by your doctor. This is important. Contact a doctor if:  You have new symptoms.  You lose weight and you do not know why.  You have trouble swallowing or it hurts to swallow.  You have wheezing or a cough that keeps happening.  Your symptoms do not get better with treatment.  You have a hoarse voice. Get help right away if:  You have pain in your arms, neck, jaw, teeth, or back.  You feel sweaty, dizzy, or light-headed.  You have chest pain or shortness of breath.  You throw up (vomit) and your throw-up looks like blood or coffee grounds.  You pass out (faint).  Your poop (stool) is bloody or black.  You cannot swallow, drink, or eat. Summary  If a person has gastroesophageal reflux disease (GERD), food and stomach acid move back up into the esophagus and cause symptoms or problems such as damage to the esophagus.  Treatment will depend on how bad your symptoms are.  Follow a diet as told by your doctor.  Take all medicines only as told by your doctor. This information  is not intended to replace advice given to you by your health care provider. Make sure you discuss any questions you have with your health care provider. Document Revised: 05/17/2018 Document Reviewed: 05/17/2018 Elsevier Patient Education  [place].   I biopsied the scar tissue called Barrett's esophagus today.  GERD information provided  Begin Protonix 40 mg once daily  Repeat EGD in 3 years tentatively  Office visit with [place] in 6 months  Further recommendations to follow pending review pathology report

## 2019-12-10 LAB — SURGICAL PATHOLOGY

## 2019-12-11 ENCOUNTER — Encounter: Payer: Self-pay | Admitting: Internal Medicine

## 2019-12-13 ENCOUNTER — Telehealth: Payer: Self-pay | Admitting: *Deleted

## 2019-12-13 NOTE — Telephone Encounter (Signed)
Left a detailed message for pt. When pts results are available, we will mail her a copy of the results and discuss them with her.

## 2019-12-13 NOTE — Telephone Encounter (Signed)
Pt called in wanting results from her EGD.  [phone]

## 2019-12-19 ENCOUNTER — Other Ambulatory Visit: Payer: Self-pay

## 2019-12-19 ENCOUNTER — Ambulatory Visit: Payer: BC Managed Care – PPO | Attending: Internal Medicine

## 2019-12-19 DIAGNOSIS — Z20822 Contact with and (suspected) exposure to covid-19: Secondary | ICD-10-CM | POA: Diagnosis not present

## 2019-12-20 LAB — NOVEL CORONAVIRUS, NAA: SARS-CoV-2, NAA: NOT DETECTED

## 2019-12-21 ENCOUNTER — Telehealth: Payer: Self-pay

## 2019-12-21 NOTE — Telephone Encounter (Signed)
Pt notified of negative COVID-19 results. Understanding verbalized.  Vanessa Stokes   

## 2020-01-27 DIAGNOSIS — Z23 Encounter for immunization: Secondary | ICD-10-CM | POA: Diagnosis not present

## 2020-02-17 DIAGNOSIS — Z23 Encounter for immunization: Secondary | ICD-10-CM | POA: Diagnosis not present

## 2020-05-28 ENCOUNTER — Encounter: Payer: Self-pay | Admitting: Gastroenterology

## 2020-05-28 ENCOUNTER — Encounter: Payer: Self-pay | Admitting: Internal Medicine

## 2020-05-28 ENCOUNTER — Ambulatory Visit: Payer: BC Managed Care – PPO | Admitting: Gastroenterology

## 2020-05-28 NOTE — Progress Notes (Deleted)
      Primary Care Physician: [place]  Primary Gastroenterologist:  Garfield Cornea, MD   No chief complaint on file.   HPI: Vanessa Stokes is a 59 y.o. female here for follow-up.  She has a history of pancreatic lesion due for surveillance MRI in October 2021, elevated pancreatic enzymes, Barrett's esophagus.  Surveillance EGD in January 2021 showed Barrett's esophagus but no dysplasia on biopsies, noncritical Schatzki ring not manipulated.  Next EGD planned in 3 years.  Current Outpatient Medications  Medication Sig Dispense Refill  . ALPRAZolam (XANAX) 0.5 MG tablet Take 0.5 mg by mouth daily as needed for anxiety.    . diphenhydramine-acetaminophen (TYLENOL PM) 25-500 MG TABS tablet Take 1 tablet by mouth at bedtime as needed (sleep).    Marland Kitchen PARoxetine (PAXIL) 20 MG tablet Take 20 mg by mouth every morning.     No current facility-administered medications for this visit.    Allergies as of 05/28/2020  . (No Known Allergies)    ROS:  General: Negative for anorexia, weight loss, fever, chills, fatigue, weakness. ENT: Negative for hoarseness, difficulty swallowing , nasal congestion. CV: Negative for chest pain, angina, palpitations, dyspnea on exertion, peripheral edema.  Respiratory: Negative for dyspnea at rest, dyspnea on exertion, cough, sputum, wheezing.  GI: See history of present illness. GU:  Negative for dysuria, hematuria, urinary incontinence, urinary frequency, nocturnal urination.  Endo: Negative for unusual weight change.    Physical Examination:   LMP 05/05/2015   General: Well-nourished, well-developed in no acute distress.  Eyes: No icterus. Mouth: Oropharyngeal mucosa moist and pink , no lesions erythema or exudate. Lungs: Clear to auscultation bilaterally.  Heart: Regular rate and rhythm, no murmurs rubs or gallops.  Abdomen: Bowel sounds are normal, nontender, nondistended, no hepatosplenomegaly or masses, no abdominal  bruits or hernia , no rebound or guarding.   Extremities: No lower extremity edema. No clubbing or deformities. Neuro: Alert and oriented x 4   Skin: Warm and dry, no jaundice.   Psych: Alert and cooperative, normal mood and affect.  Labs:  ***  Imaging Studies: No results found.

## 2020-06-22 ENCOUNTER — Other Ambulatory Visit: Payer: Self-pay

## 2020-06-22 ENCOUNTER — Emergency Department (HOSPITAL_COMMUNITY): Payer: BC Managed Care – PPO

## 2020-06-22 ENCOUNTER — Encounter (HOSPITAL_COMMUNITY): Payer: Self-pay | Admitting: Emergency Medicine

## 2020-06-22 ENCOUNTER — Emergency Department (HOSPITAL_COMMUNITY)
Admission: EM | Admit: 2020-06-22 | Discharge: 2020-06-22 | Disposition: A | Discharge status: Home / Self Care | Payer: BC Managed Care – PPO | Attending: Emergency Medicine | Admitting: Emergency Medicine

## 2020-06-22 DIAGNOSIS — M7611 Psoas tendinitis, right hip: Secondary | ICD-10-CM | POA: Insufficient documentation

## 2020-06-22 DIAGNOSIS — R52 Pain, unspecified: Secondary | ICD-10-CM

## 2020-06-22 DIAGNOSIS — M1990 Unspecified osteoarthritis, unspecified site: Secondary | ICD-10-CM | POA: Insufficient documentation

## 2020-06-22 DIAGNOSIS — M76891 Other specified enthesopathies of right lower limb, excluding foot: Secondary | ICD-10-CM

## 2020-06-22 DIAGNOSIS — M25551 Pain in right hip: Secondary | ICD-10-CM | POA: Diagnosis not present

## 2020-06-22 DIAGNOSIS — M779 Enthesopathy, unspecified: Secondary | ICD-10-CM | POA: Diagnosis not present

## 2020-06-22 DIAGNOSIS — M1611 Unilateral primary osteoarthritis, right hip: Secondary | ICD-10-CM | POA: Diagnosis not present

## 2020-06-22 DIAGNOSIS — F17210 Nicotine dependence, cigarettes, uncomplicated: Secondary | ICD-10-CM | POA: Insufficient documentation

## 2020-06-22 MED ORDER — PREDNISONE 10 MG PO TABS: ORAL_TABLET | ORAL | 0 refills | Status: DC

## 2020-06-22 MED ORDER — PREDNISONE 50 MG PO TABS
60.0000 mg | ORAL_TABLET | Freq: Once | ORAL | Status: AC
  Administered 2020-06-22: 60 mg via ORAL
  Filled 2020-06-22: qty 1

## 2020-06-22 NOTE — ED Triage Notes (Signed)
Pt c/o rt hip pain for the last 6 months.

## 2020-06-22 NOTE — ED Provider Notes (Signed)
[facility] EMERGENCY DEPARTMENT Provider Note   CSN: [id number] Arrival date & time: 06/22/20  1405     History Chief Complaint  Patient presents with  . Hip Pain    Vanessa Stokes is a 59 y.o. female with past medical history as outlined below, most significant for GERD with documented Barrett's esophagus, depression, acromegaly presenting with an approximate 78-month history of right hip pain.  She denies any specific injury, reports slowly progressive pain which originates in her anterior upper femur and radiates to her knee, distally with movements such as hip flexion, describing having to pick up the right leg with her hands when trying to climb into her SUV.  She denies swelling, bruising, also no numbness or weakness in the extremity.  No history of low back pain.  HPI     Past Medical History:  Diagnosis Date  . Acromegaly ([place])   . Anemia   . Bloated abdomen 05/12/2015  . Depression    Paxil  . Dysmenorrhea 05/12/2015  . Fatigue 05/12/2015  . Fibroid 05/15/2015  . Fibroids 05/19/2015  . Menometrorrhagia 05/12/2015  . Pancreatic lesion 11/30/2017   Density in pancreatic head needs F/U in 1 year   . Postmenopausal 05/15/2015  . Postmenopausal bleeding 05/19/2015  . Vaginal wall cyst 05/12/2015    Patient Active Problem List   Diagnosis Date Noted  . Elevated lipase 10/03/2019  . LLQ fullness 10/03/2019  . Barrett's esophagus 10/03/2019  . Pancreatic lesion 11/30/2017  . Urinary frequency 11/30/2017  . Cystocele without uterine prolapse 11/30/2017  . Fibroids 05/19/2015  . Postmenopausal bleeding 05/19/2015  . Fibroid 05/15/2015  . Postmenopausal 05/15/2015  . Menometrorrhagia 05/12/2015  . Dysmenorrhea 05/12/2015  . Bloated abdomen 05/12/2015  . Fatigue 05/12/2015  . Vaginal wall cyst 05/12/2015  . Anemia 01/02/2014  . Other dysphagia 01/02/2014    Past Surgical History:  Procedure Laterality Date  . BIOPSY  12/07/2019   Procedure: BIOPSY;  Surgeon: Daneil Dolin, MD;  Location: [facility] SUITE;  Service: Endoscopy;;  . BRAIN SURGERY    . COLONOSCOPY  12/04/2008   Dr. Rourk:Friable anal canal and anal papilla, otherwise normal rectum/Left-sided diverticula polyp at the splenic flexure, status post cold snared.  The remainder of the colonic mucosa and terminal  ileum mucosa appeared normal. BENIGN polyp.   . COLONOSCOPY, ESOPHAGOGASTRODUODENOSCOPY (EGD) AND ESOPHAGEAL DILATION N/A 01/28/2014   Dr. Gala Romney: Erosive reflux esophagitis, biopsy-proven short segment Barrett's esophagus with no dysplasia, status post Maloney dilation, hernia, gastric erosions with benign biopsy/no H. pylori, benign small bowel biopsies.  Normal ileocolonoscopy.  Plans to repeat EGD in 1 year and colonoscopy in 10 years.  . ESOPHAGOGASTRODUODENOSCOPY N/A 12/07/2019   Rourk: Barrett's esophagus with no dysplasia, noncritical Schatzki ring not manipulated.  Next EGD in January 2024.  . pituitary gland tumors removed     X 2     OB History    Gravida  2   Para  1   Term      Preterm      AB  1   Living  1     SAB  1   TAB      Ectopic      Multiple      Live Births              Family History  Problem Relation Age of Onset  . Cancer Mother        lung  . Cancer Father  lung  . Other Daughter        hip problems; degenerative hip dysplasia  . Colon cancer Neg Hx   . Pancreatic disease Neg Hx     Social History   Tobacco Use  . Smoking status: Current Every Day Smoker    Packs/day: 0.50    Types: Cigarettes  . Smokeless tobacco: Never Used  . Tobacco comment: Smokes about 3 cigarettes daily  Substance Use Topics  . Alcohol use: Yes    Alcohol/week: 0.0 standard drinks    Comment: drank heavily from age [age]. rare etoh since then  . Drug use: No    Home Medications Prior to Admission medications   Medication Sig Start Date End Date Taking? Authorizing Provider  ALPRAZolam Duanne Moron) 0.5 MG tablet Take 0.5 mg by mouth daily as  needed for anxiety.    [provider]  diphenhydramine-acetaminophen (TYLENOL PM) 25-500 MG TABS tablet Take 1 tablet by mouth at bedtime as needed (sleep).    [provider]  PARoxetine (PAXIL) 20 MG tablet Take 20 mg by mouth every morning.    [provider]  predniSONE (DELTASONE) 10 MG tablet Take 6 tablets day one, 5 tablets day two, 4 tablets day three, 3 tablets day four, 2 tablets day five, then 1 tablet day six 06/22/20   [name], PA-C    Allergies    Patient has no known allergies.  Review of Systems   Review of Systems  Constitutional: Negative for fever.  Musculoskeletal: Positive for arthralgias. Negative for joint swelling and myalgias.  Neurological: Negative for weakness and numbness.  All other systems reviewed and are negative.   Physical Exam Updated Vital Signs BP 122/78 (BP Location: Right Arm)   Pulse 78   Temp 98.3 F (36.8 C) (Oral)   Resp 16   Ht 5\' 4"  (1.626 m)   Wt 64.9 kg   LMP 05/05/2015   SpO2 100%   BMI 24.55 kg/m   Physical Exam Vitals reviewed.  Constitutional:      Appearance: She is well-developed.  HENT:     Head: Atraumatic.  Musculoskeletal:        General: Tenderness present.     Cervical back: Normal range of motion.     Right upper leg: Tenderness present. No swelling, edema or deformity.     Comments: Mild ttp right anterior lateral hip inferior to pelvic rim.  No edema, no crepitus.  Pain exacerbated with active hip flexion, not worsened with passive, or with abduction/adduction maneuvers. No pain over greater trochanter.  Skin:    General: Skin is warm and dry.  Neurological:     Mental Status: She is alert.     Sensory: No sensory deficit.     Deep Tendon Reflexes: Reflexes normal.     ED Results / Procedures / Treatments   Labs (all labs ordered are listed, but only abnormal results are displayed) Labs Reviewed - No data to display  EKG None  Radiology DG HIP UNILAT WITH PELVIS  2-3 VIEWS RIGHT  Result Date: 06/22/2020 CLINICAL DATA:  Right hip pain for 6 months. EXAM: DG HIP (WITH OR WITHOUT PELVIS) 2-3V RIGHT COMPARISON:  02/07/2017 FINDINGS: No fracture. No subluxation or dislocation. SI joints and symphysis pubis unremarkable. Joint space in the hips is symmetric and well preserved. Mild spurring noted in the right femoral head. IMPRESSION: Minimal degenerative spurring in the right femoral head. Otherwise unremarkable. Electronically Signed   By: Verda Cumins.D.  On: 06/22/2020 15:09    Procedures Procedures (including critical care time)  Medications Ordered in ED Medications  predniSONE (DELTASONE) tablet 60 mg (60 mg Oral Given 06/22/20 1601)    ED Course  I have reviewed the triage vital signs and the nursing notes.  Pertinent labs & imaging results that were available during my care of the patient were reviewed by me and considered in my medical decision making (see chart for details).    MDM Rules/Calculators/A&P                          Imaging reviewed and discussed with patient.  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  I favor this being a chronic hip flexor tendinitis.  Discussed role of ice and heat, activity as tolerated, she was placed on a prednisone taper.  She was also given a referral to orthopedic for follow-up care if symptoms persist despite this treatment plan.  She may benefit from either physical therapy or further imaging to assess for soft tissue injury/inflammation. Final Clinical Impression(s) / ED Diagnoses Final diagnoses:  Tendinitis of right hip flexor    Rx / DC Orders ED Discharge Orders         Ordered    predniSONE (DELTASONE) 10 MG tablet     Discontinue  Reprint     06/22/20 1536           [name], PA-C 06/22/20 Kathyrn Drown    Lajean Saver, MD 06/26/20 1047

## 2020-06-22 NOTE — Discharge Instructions (Addendum)
Your xrays are reassuring today. You do have mild arthritis in your hip, but your pain is more suggestive of a tendinitis of your hip flexor muscles.  Apply ice and heat as discussed (ice if needed after a long shift to help with inflammation) but also using heat for 20 minutes several times daily to help healing.  Take the prednisone as prescribed.  Call Dr. Aline Brochure for a recheck if your symptoms are not improving with this treatment. You may benefit from physical therapy and/or steroid injections for further treatment.

## 2020-06-22 NOTE — ED Notes (Signed)
Reports R hip pain for the last 6 months  Has worked continuously  ambulates heel to toe without  Change of gait, facial features  Or ease of movement   Followed by Dr Armandina Gemma but has not seen due to her work schedule

## 2020-07-23 DIAGNOSIS — B029 Zoster without complications: Secondary | ICD-10-CM | POA: Diagnosis not present

## 2020-07-23 DIAGNOSIS — L309 Dermatitis, unspecified: Secondary | ICD-10-CM | POA: Diagnosis not present

## 2020-07-30 ENCOUNTER — Telehealth: Payer: Self-pay | Admitting: Internal Medicine

## 2020-07-30 NOTE — Telephone Encounter (Signed)
Recall for mri °

## 2020-07-31 NOTE — Telephone Encounter (Signed)
Recall mailed 

## 2020-08-12 ENCOUNTER — Telehealth: Payer: Self-pay | Admitting: Internal Medicine

## 2020-08-12 DIAGNOSIS — K862 Cyst of pancreas: Secondary | ICD-10-CM

## 2020-08-12 NOTE — Addendum Note (Signed)
Addended by: Cheron Every on: 08/12/2020 04:07 PM   Modules accepted: Orders

## 2020-08-12 NOTE — Telephone Encounter (Signed)
Letter mailed with appt information  PA for MRI approved via [employer]. Auth# [id number] dates 08/12/2020-02/07/2021

## 2020-08-12 NOTE — Telephone Encounter (Signed)
PATIENT RECEIVED LETTER TO SCHEDULE MRI, PLEASE CALL WHEN YOU CAN

## 2020-09-05 ENCOUNTER — Other Ambulatory Visit: Payer: Self-pay

## 2020-09-05 ENCOUNTER — Other Ambulatory Visit: Payer: Self-pay | Admitting: Gastroenterology

## 2020-09-05 ENCOUNTER — Ambulatory Visit (HOSPITAL_COMMUNITY)
Admission: RE | Admit: 2020-09-05 | Discharge: 2020-09-05 | Disposition: A | Discharge status: Home / Self Care | Payer: BC Managed Care – PPO | Source: Ambulatory Visit | Attending: Gastroenterology | Admitting: Gastroenterology

## 2020-09-05 DIAGNOSIS — K862 Cyst of pancreas: Secondary | ICD-10-CM | POA: Diagnosis not present

## 2020-09-05 MED ORDER — GADOBUTROL 1 MMOL/ML IV SOLN
6.0000 mL | Freq: Once | INTRAVENOUS | Status: AC | PRN
  Administered 2020-09-05: 6 mL via INTRAVENOUS

## 2020-09-27 DIAGNOSIS — Z23 Encounter for immunization: Secondary | ICD-10-CM | POA: Diagnosis not present

## 2020-10-30 DIAGNOSIS — E220 Acromegaly and pituitary gigantism: Secondary | ICD-10-CM | POA: Diagnosis not present

## 2020-10-30 DIAGNOSIS — Z8639 Personal history of other endocrine, nutritional and metabolic disease: Secondary | ICD-10-CM | POA: Diagnosis not present

## 2020-11-12 DIAGNOSIS — E7849 Other hyperlipidemia: Secondary | ICD-10-CM | POA: Diagnosis not present

## 2020-11-12 DIAGNOSIS — Z23 Encounter for immunization: Secondary | ICD-10-CM | POA: Diagnosis not present

## 2020-11-12 DIAGNOSIS — Z0001 Encounter for general adult medical examination with abnormal findings: Secondary | ICD-10-CM | POA: Diagnosis not present

## 2020-11-12 DIAGNOSIS — M1991 Primary osteoarthritis, unspecified site: Secondary | ICD-10-CM | POA: Diagnosis not present

## 2020-11-12 DIAGNOSIS — Z1389 Encounter for screening for other disorder: Secondary | ICD-10-CM | POA: Diagnosis not present

## 2020-11-12 DIAGNOSIS — Z1331 Encounter for screening for depression: Secondary | ICD-10-CM | POA: Diagnosis not present

## 2020-11-12 DIAGNOSIS — Z6824 Body mass index (BMI) 24.0-24.9, adult: Secondary | ICD-10-CM | POA: Diagnosis not present

## 2020-11-12 DIAGNOSIS — D509 Iron deficiency anemia, unspecified: Secondary | ICD-10-CM | POA: Diagnosis not present

## 2020-11-13 ENCOUNTER — Other Ambulatory Visit (HOSPITAL_COMMUNITY): Payer: Self-pay | Admitting: Family Medicine

## 2020-11-13 DIAGNOSIS — Z1231 Encounter for screening mammogram for malignant neoplasm of breast: Secondary | ICD-10-CM

## 2020-11-19 DIAGNOSIS — E220 Acromegaly and pituitary gigantism: Secondary | ICD-10-CM | POA: Diagnosis not present

## 2020-11-19 DIAGNOSIS — Z8639 Personal history of other endocrine, nutritional and metabolic disease: Secondary | ICD-10-CM | POA: Diagnosis not present

## 2020-11-20 ENCOUNTER — Encounter: Payer: Self-pay | Admitting: Internal Medicine

## 2021-02-16 ENCOUNTER — Ambulatory Visit (HOSPITAL_COMMUNITY): Payer: BC Managed Care – PPO

## 2021-02-26 DIAGNOSIS — Z23 Encounter for immunization: Secondary | ICD-10-CM | POA: Diagnosis not present

## 2021-03-10 ENCOUNTER — Ambulatory Visit: Payer: BC Managed Care – PPO | Admitting: Gastroenterology

## 2021-03-24 ENCOUNTER — Emergency Department (HOSPITAL_COMMUNITY): Payer: BC Managed Care – PPO

## 2021-03-24 ENCOUNTER — Other Ambulatory Visit: Payer: Self-pay

## 2021-03-24 ENCOUNTER — Emergency Department (HOSPITAL_COMMUNITY)
Admission: EM | Admit: 2021-03-24 | Discharge: 2021-03-24 | Disposition: A | Discharge status: Home / Self Care | Payer: BC Managed Care – PPO | Attending: Emergency Medicine | Admitting: Emergency Medicine

## 2021-03-24 ENCOUNTER — Encounter (HOSPITAL_COMMUNITY): Payer: Self-pay | Admitting: Emergency Medicine

## 2021-03-24 DIAGNOSIS — F17210 Nicotine dependence, cigarettes, uncomplicated: Secondary | ICD-10-CM | POA: Insufficient documentation

## 2021-03-24 DIAGNOSIS — N852 Hypertrophy of uterus: Secondary | ICD-10-CM | POA: Diagnosis not present

## 2021-03-24 DIAGNOSIS — R109 Unspecified abdominal pain: Secondary | ICD-10-CM | POA: Diagnosis not present

## 2021-03-24 DIAGNOSIS — M549 Dorsalgia, unspecified: Secondary | ICD-10-CM | POA: Diagnosis not present

## 2021-03-24 DIAGNOSIS — K862 Cyst of pancreas: Secondary | ICD-10-CM | POA: Diagnosis not present

## 2021-03-24 DIAGNOSIS — N390 Urinary tract infection, site not specified: Secondary | ICD-10-CM | POA: Diagnosis not present

## 2021-03-24 DIAGNOSIS — R10A2 Flank pain, left side: Secondary | ICD-10-CM

## 2021-03-24 LAB — URINALYSIS, ROUTINE W REFLEX MICROSCOPIC
Bacteria, UA: NONE SEEN
Bilirubin Urine: NEGATIVE
Glucose, UA: NEGATIVE mg/dL
Ketones, ur: NEGATIVE mg/dL
Nitrite: NEGATIVE
Protein, ur: NEGATIVE mg/dL
Specific Gravity, Urine: 1.02 (ref 1.005–1.030)
pH: 5 (ref 5.0–8.0)

## 2021-03-24 LAB — BASIC METABOLIC PANEL
Anion gap: 8 (ref 5–15)
BUN: 18 mg/dL (ref 6–20)
CO2: 23 mmol/L (ref 22–32)
Calcium: 8.6 mg/dL — ABNORMAL LOW (ref 8.9–10.3)
Chloride: 105 mmol/L (ref 98–111)
Creatinine, Ser: 0.47 mg/dL (ref 0.44–1.00)
GFR, Estimated: >60 mL/min (ref >60)
Glucose, Bld: 109 mg/dL — ABNORMAL HIGH (ref 70–99)
Potassium: 3.8 mmol/L (ref 3.5–5.1)
Sodium: 136 mmol/L (ref 135–145)

## 2021-03-24 LAB — CBC WITH DIFFERENTIAL/PLATELET
Abs Immature Granulocytes: 0.01 10*3/uL (ref 0.00–0.07)
Basophils Absolute: 0 10*3/uL (ref 0.0–0.1)
Basophils Relative: 1 %
Eosinophils Absolute: 0.1 10*3/uL (ref 0.0–0.5)
Eosinophils Relative: 3 %
HCT: 40.6 % (ref 36.0–46.0)
Hemoglobin: 13 g/dL (ref 12.0–15.0)
Immature Granulocytes: 0 %
Lymphocytes Relative: 26 %
Lymphs Abs: 0.6 10*3/uL — ABNORMAL LOW (ref 0.7–4.0)
MCH: 30.8 pg (ref 26.0–34.0)
MCHC: 32 g/dL (ref 30.0–36.0)
MCV: 96.2 fL (ref 80.0–100.0)
Monocytes Absolute: 0.3 10*3/uL (ref 0.1–1.0)
Monocytes Relative: 13 %
Neutro Abs: 1.4 10*3/uL — ABNORMAL LOW (ref 1.7–7.7)
Neutrophils Relative %: 57 %
Platelets: 143 10*3/uL — ABNORMAL LOW (ref 150–400)
RBC: 4.22 MIL/uL (ref 3.87–5.11)
RDW: 12.6 % (ref 11.5–15.5)
WBC: 2.5 10*3/uL — ABNORMAL LOW (ref 4.0–10.5)
nRBC: 0 % (ref 0.0–0.2)

## 2021-03-24 MED ORDER — SODIUM CHLORIDE 0.9 % IV SOLN
1.0000 g | Freq: Once | INTRAVENOUS | Status: AC
  Administered 2021-03-24: 1 g via INTRAVENOUS
  Filled 2021-03-24: qty 10

## 2021-03-24 MED ORDER — MORPHINE SULFATE (PF) 4 MG/ML IV SOLN
4.0000 mg | Freq: Once | INTRAVENOUS | Status: AC
Start: 2021-03-24 — End: 2021-03-24
  Administered 2021-03-24: 4 mg via INTRAVENOUS
  Filled 2021-03-24: qty 1

## 2021-03-24 MED ORDER — HYDROCODONE-ACETAMINOPHEN 5-325 MG PO TABS: 1.0000 | ORAL_TABLET | Freq: Four times a day (QID) | ORAL | 0 refills | Status: DC | PRN

## 2021-03-24 MED ORDER — CEPHALEXIN 500 MG PO CAPS: 500.0000 mg | ORAL_CAPSULE | Freq: Three times a day (TID) | ORAL | 0 refills | Status: DC

## 2021-03-24 MED ORDER — KETOROLAC TROMETHAMINE 30 MG/ML IJ SOLN
30.0000 mg | Freq: Once | INTRAMUSCULAR | Status: AC
  Administered 2021-03-24: 30 mg via INTRAVENOUS
  Filled 2021-03-24: qty 1

## 2021-03-24 NOTE — ED Provider Notes (Signed)
[facility] EMERGENCY DEPARTMENT Provider Note   CSN: [id number] Arrival date & time: 03/24/21  0344     History Chief Complaint  Patient presents with  . Flank Pain    Vanessa Stokes is a 60 y.o. female.  Patient is a 60 year old female with past medical history of fibroids.  She presents today for evaluation of left flank pain.  This began yesterday morning in the absence of any injury or trauma.  The pain waxed and waned throughout the day yesterday, then became much more severe just hours prior to arrival.  She denies any bowel or bladder complaints.  She denies any fevers or chills.  The history is provided by the patient.  Flank Pain This is a new problem. The problem occurs constantly. The problem has been rapidly worsening. Pertinent negatives include no abdominal pain. The symptoms are aggravated by twisting (Movement and position). Nothing relieves the symptoms. She has tried nothing for the symptoms.       Past Medical History:  Diagnosis Date  . 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 Anemia   . Bloated abdomen 05/12/2015  . Depression    Paxil  . Dysmenorrhea 05/12/2015  . Fatigue 05/12/2015  . Fibroid 05/15/2015  . Fibroids 05/19/2015  . Menometrorrhagia 05/12/2015  . Pancreatic lesion 11/30/2017   Density in pancreatic head needs F/U in 1 year   . Postmenopausal 05/15/2015  . Postmenopausal bleeding 05/19/2015  . Vaginal wall cyst 05/12/2015    Patient Active Problem List   Diagnosis Date Noted  . Elevated lipase 10/03/2019  . LLQ fullness 10/03/2019  . Barrett's esophagus 10/03/2019  . Pancreatic lesion 11/30/2017  . Urinary frequency 11/30/2017  . Cystocele without uterine prolapse 11/30/2017  . Fibroids 05/19/2015  . Postmenopausal bleeding 05/19/2015  . Fibroid 05/15/2015  . Postmenopausal 05/15/2015  . Menometrorrhagia 05/12/2015  . Dysmenorrhea 05/12/2015  . Bloated abdomen 05/12/2015  . Fatigue 05/12/2015  . Vaginal wall cyst 05/12/2015  . Anemia 01/02/2014  .  Other dysphagia 01/02/2014    Past Surgical History:  Procedure Laterality Date  . BIOPSY  12/07/2019   Procedure: BIOPSY;  Surgeon: Daneil Dolin, MD;  Location: [facility];  Service: Endoscopy;;  . BRAIN SURGERY    . COLONOSCOPY  12/04/2008   Dr. Rourk:Friable anal canal and anal papilla, otherwise normal rectum/Left-sided diverticula polyp at the splenic flexure, status post cold snared.  The remainder of the colonic mucosa and terminal  ileum mucosa appeared normal. BENIGN polyp.   . COLONOSCOPY, ESOPHAGOGASTRODUODENOSCOPY (EGD) AND ESOPHAGEAL DILATION N/A 01/28/2014   Dr. Gala Romney: Erosive reflux esophagitis, biopsy-proven short segment Barrett's esophagus with no dysplasia, status post Maloney dilation, hernia, gastric erosions with benign biopsy/no H. pylori, benign small bowel biopsies.  Normal ileocolonoscopy.  Plans to repeat EGD in 1 year and colonoscopy in 10 years.  . ESOPHAGOGASTRODUODENOSCOPY N/A 12/07/2019   Rourk: Barrett's esophagus with no dysplasia, noncritical Schatzki ring not manipulated.  Next EGD in January 2024.  . pituitary gland tumors removed     X 2     OB History    Gravida  2   Para  1   Term      Preterm      AB  1   Living  1     SAB  1   IAB      Ectopic      Multiple      Live Births  Family History  Problem Relation Age of Onset  . Cancer Mother        lung  . Cancer Father        lung  . Other Daughter        hip problems; degenerative hip dysplasia  . Colon cancer Neg Hx   . Pancreatic disease Neg Hx     Social History   Tobacco Use  . Smoking status: Current Every Day Smoker    Packs/day: 0.50    Types: Cigarettes  . Smokeless tobacco: Never Used  . Tobacco comment: Smokes about 3 cigarettes daily  Substance Use Topics  . Alcohol use: Yes    Alcohol/week: 0.0 standard drinks    Comment: drank heavily from age [age]. rare etoh since then  . Drug use: No    Home Medications Prior to Admission  medications   Medication Sig Start Date End Date Taking? Authorizing Provider  ALPRAZolam Duanne Moron) 0.5 MG tablet Take 0.5 mg by mouth daily as needed for anxiety.    [provider]  diphenhydramine-acetaminophen (TYLENOL PM) 25-500 MG TABS tablet Take 1 tablet by mouth at bedtime as needed (sleep).    [provider]  PARoxetine (PAXIL) 20 MG tablet Take 20 mg by mouth every morning.    [provider]  predniSONE (DELTASONE) 10 MG tablet Take 6 tablets day one, 5 tablets day two, 4 tablets day three, 3 tablets day four, 2 tablets day five, then 1 tablet day six 06/22/20   [name], PA-C    Allergies    Patient has no known allergies.  Review of Systems   Review of Systems  Gastrointestinal: Negative for abdominal pain.  Genitourinary: Positive for flank pain.  All other systems reviewed and are negative.   Physical Exam Updated Vital Signs BP 140/85   Pulse 63   Temp 98.1 F (36.7 C) (Oral)   Resp 18   Ht 5\' 4"  (1.626 m)   Wt 63.5 kg   LMP 05/05/2015   SpO2 100%   BMI 24.03 kg/m   Physical Exam Vitals and nursing note reviewed.  Constitutional:      General: She is not in acute distress.    Appearance: She is well-developed. She is not diaphoretic.  HENT:     Head: Normocephalic and atraumatic.  Cardiovascular:     Rate and Rhythm: Normal rate and regular rhythm.     Heart sounds: No murmur heard. No friction rub. No gallop.   Pulmonary:     Effort: Pulmonary effort is normal. No respiratory distress.     Breath sounds: Normal breath sounds. No wheezing.  Abdominal:     General: Bowel sounds are normal. There is no distension.     Palpations: Abdomen is soft.     Tenderness: There is no abdominal tenderness. There is left CVA tenderness. There is no right CVA tenderness, guarding or rebound.  Musculoskeletal:        General: Normal range of motion.     Cervical back: Normal range of motion and neck supple.  Skin:    General: Skin is  warm and dry.  Neurological:     Mental Status: She is alert and oriented to person, place, and time.     ED Results / Procedures / Treatments   Labs (all labs ordered are listed, but only abnormal results are displayed) Labs Reviewed  URINALYSIS, ROUTINE W REFLEX MICROSCOPIC  BASIC METABOLIC PANEL  CBC WITH DIFFERENTIAL/PLATELET    EKG None  Radiology No results found.  Procedures Procedures   Medications Ordered in ED Medications  ketorolac (TORADOL) 30 MG/ML injection 30 mg (has no administration in time range)    ED Course  I have reviewed the triage vital signs and the nursing notes.  Pertinent labs & imaging results that were available during my care of the patient were reviewed by me and considered in my medical decision making (see chart for details).    MDM Rules/Calculators/A&P  Patient presenting with a 24-hour history of pain in the left flank that began in the absence of any injury or trauma.  Clinically, her symptoms seem musculoskeletal, but she does have evidence for a UTI.  CT scan shows no evidence for renal calculus or other acute intra-abdominal process.  Patient given Toradol and morphine for pain and seems to be feeling better.  She will be given IV Rocephin, then discharged with Keflex and pain medicine.  To return as needed if symptoms worsen or change.  Final Clinical Impression(s) / ED Diagnoses Final diagnoses:  None    Rx / DC Orders ED Discharge Orders    None       Veryl Speak, MD 03/24/21 [phone]

## 2021-03-24 NOTE — Discharge Instructions (Addendum)
Begin taking Keflex as prescribed.  Take ibuprofen 600 mg every 6 hours as needed for pain.  Take hydrocodone as prescribed as needed for pain not relieved with ibuprofen.  Follow-up with primary doctor if symptoms or not improving in the next few days, and return to the ER if symptoms significantly worsen or change.

## 2021-03-24 NOTE — ED Triage Notes (Signed)
Pt c/o right flank/back pain since Sunday.

## 2021-03-26 DIAGNOSIS — M5416 Radiculopathy, lumbar region: Secondary | ICD-10-CM | POA: Diagnosis not present

## 2021-06-01 ENCOUNTER — Ambulatory Visit: Payer: BC Managed Care – PPO | Admitting: Gastroenterology

## 2021-08-05 ENCOUNTER — Telehealth: Payer: Self-pay | Admitting: Internal Medicine

## 2021-08-05 NOTE — Telephone Encounter (Signed)
Letter mailed

## 2021-08-05 NOTE — Telephone Encounter (Signed)
Recall for mri °

## 2021-08-20 DIAGNOSIS — Z23 Encounter for immunization: Secondary | ICD-10-CM | POA: Diagnosis not present

## 2021-08-31 ENCOUNTER — Telehealth: Payer: Self-pay | Admitting: Internal Medicine

## 2021-08-31 DIAGNOSIS — K862 Cyst of pancreas: Secondary | ICD-10-CM

## 2021-08-31 NOTE — Telephone Encounter (Signed)
MRI abd w/wo contrast scheduled for 09/11/21 at 9:00am, arrive at 8:30am. NPO 4 hours prior to test.  Tried to call pt, left detailed message to inform pt of MRI appt. Letter mailed.

## 2021-08-31 NOTE — Telephone Encounter (Signed)
Pt received letter to schedule a scan this month. Please call [phone]

## 2021-08-31 NOTE — Telephone Encounter (Signed)
PA for MRI submitted via [employer] website. Order ID: [id number], valid 08/31/21-09/29/21.

## 2021-09-02 DIAGNOSIS — F419 Anxiety disorder, unspecified: Secondary | ICD-10-CM | POA: Diagnosis not present

## 2021-09-03 DIAGNOSIS — J069 Acute upper respiratory infection, unspecified: Secondary | ICD-10-CM | POA: Diagnosis not present

## 2021-09-03 DIAGNOSIS — L049 Acute lymphadenitis, unspecified: Secondary | ICD-10-CM | POA: Diagnosis not present

## 2021-09-11 ENCOUNTER — Other Ambulatory Visit: Payer: Self-pay | Admitting: Gastroenterology

## 2021-09-11 ENCOUNTER — Ambulatory Visit (HOSPITAL_COMMUNITY)
Admission: RE | Admit: 2021-09-11 | Discharge: 2021-09-11 | Disposition: A | Discharge status: Home / Self Care | Payer: BC Managed Care – PPO | Source: Ambulatory Visit | Attending: Gastroenterology | Admitting: Gastroenterology

## 2021-09-11 ENCOUNTER — Other Ambulatory Visit: Payer: Self-pay

## 2021-09-11 DIAGNOSIS — K862 Cyst of pancreas: Secondary | ICD-10-CM | POA: Insufficient documentation

## 2021-09-11 MED ORDER — GADOBUTROL 1 MMOL/ML IV SOLN
6.0000 mL | Freq: Once | INTRAVENOUS | Status: AC | PRN
  Administered 2021-09-11: 6 mL via INTRAVENOUS

## 2021-09-15 ENCOUNTER — Telehealth: Payer: Self-pay | Admitting: Internal Medicine

## 2021-09-15 NOTE — Telephone Encounter (Signed)
See result note.  

## 2021-09-15 NOTE — Telephone Encounter (Signed)
Pt returning call. [phone]

## 2021-09-21 ENCOUNTER — Encounter: Payer: Self-pay | Admitting: Gastroenterology

## 2021-09-21 ENCOUNTER — Ambulatory Visit: Payer: BC Managed Care – PPO | Admitting: Gastroenterology

## 2021-09-21 ENCOUNTER — Other Ambulatory Visit: Payer: Self-pay

## 2021-09-21 VITALS — BP 99/71 | HR 70 | Temp 96.9°F | Ht 64.0 in | Wt 142.6 lb

## 2021-09-21 DIAGNOSIS — D72829 Elevated white blood cell count, unspecified: Secondary | ICD-10-CM | POA: Insufficient documentation

## 2021-09-21 DIAGNOSIS — K2270 Barrett's esophagus without dysplasia: Secondary | ICD-10-CM | POA: Diagnosis not present

## 2021-09-21 DIAGNOSIS — D72819 Decreased white blood cell count, unspecified: Secondary | ICD-10-CM

## 2021-09-21 DIAGNOSIS — K869 Disease of pancreas, unspecified: Secondary | ICD-10-CM

## 2021-09-21 DIAGNOSIS — D696 Thrombocytopenia, unspecified: Secondary | ICD-10-CM

## 2021-09-21 NOTE — Progress Notes (Signed)
Primary Care Physician: Sharilyn Sites, MD  Primary Gastroenterologist:  Garfield Cornea, MD   Chief Complaint  Patient presents with   pancreatic lesion    Doing ok    HPI: Vanessa Stokes is a 60 y.o. female here for follow-up.  She was last seen November 2020.  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   Since we last saw her she states her daughter was diagnosed with a rare pancreatic cancer at age 60.  Underwent resection of the pancreatic tail but did not require any additional adjuvant therapy.  Clinically patient is feeling well.  Denies abdominal pain, upper GI symptoms.  Bowel movements are regular.  No weight loss.  No blood in the stool or melena.  No dysphagia she has a history of Barrett's, due for repeat EGD in January 2024.  Labs back in May in the ED showed leukopenia, mild thrombocytopenia.  CT renal stone protocol unremarkable for acute findings.  4 mm pancreatic cyst at the uncinate process seen.  Enlarged uterus with apparent 4 cm fibroid noted.  Patient states she is overdue to see her gynecologist.  Does not have any bleeding concerns.    Patient had her yearly MRI recently showed unchanged 0.5 cm cystic lesion of the pancreatic uncinate.  Has been stable for at least 3 years, likely benign pseudocyst or IPMN.  Based on ACR consensus guidelines for management of incidental pancreatic cyst (reimage every 1 year for 5 years, then every 2 years x 2, if stable over 9 years, then stop) would recommend MRI abdomen with and without contrast in 1 year.  EGD January 2021: -Salmon-colored mucosa -distal esophagus -consistant with history of biopsy-proven Barrett's esophagus?appeared to be a lesser degree than seen at prior EGD. Status post biopsy. -Hiatal hernia. Noncritical Schatzki's ring not manipulated. Normal stomach. Normal D1-D2. -Barrett's esophagus, negative for dysplasia. -Recommend repeat  EGD in 3 years (January 2024).  Current Outpatient Medications  Medication Sig Dispense Refill   ALPRAZolam (XANAX) 0.5 MG tablet Take 0.5 mg by mouth daily as needed for anxiety.     diphenhydramine-acetaminophen (TYLENOL PM) 25-500 MG TABS tablet Take 1 tablet by mouth at bedtime as needed (sleep).     PARoxetine (PAXIL) 20 MG tablet Take 20 mg by mouth every morning.     No current facility-administered medications for this visit.    Allergies as of 09/21/2021   (No Known Allergies)    ROS:  General: Negative for anorexia, weight loss, fever, chills, fatigue, weakness. ENT: Negative for hoarseness, difficulty swallowing , nasal congestion. CV: Negative for chest pain, angina, palpitations, dyspnea on exertion, peripheral edema.  Respiratory: Negative for dyspnea at rest, dyspnea on exertion, cough, sputum, wheezing.  GI: See history of present illness. GU:  Negative for dysuria, hematuria, urinary incontinence, urinary frequency, nocturnal urination.  Endo: Negative for unusual weight change.    Physical Examination:   BP 99/71   Pulse 70   Temp (!) 96.9 F (36.1 C) (Temporal)   Ht 5\' 4"  (1.626 m)   Wt 142 lb 9.6 oz (64.7 kg)   LMP 05/05/2015   BMI 24.48 kg/m   General: Well-nourished, well-developed in no acute distress.  Eyes: No icterus. Mouth: masked Abdomen: Bowel sounds are normal, nontender, nondistended, no hepatosplenomegaly or masses, no abdominal bruits or hernia , no rebound or guarding.   Extremities: No lower extremity edema. No clubbing or deformities.  Neuro: Alert and oriented x 4   Skin: Warm and dry, no jaundice.   Psych: Alert and cooperative, normal mood and affect.  Labs:  Lab Results  Component Value Date   CREATININE 0.47 03/24/2021   BUN 18 03/24/2021   NA 136 03/24/2021   K 3.8 03/24/2021   CL 105 03/24/2021   CO2 23 03/24/2021   Lab Results  Component Value Date   WBC 2.5 (L) 03/24/2021   HGB 13.0 03/24/2021   HCT 40.6  03/24/2021   MCV 96.2 03/24/2021   PLT 143 (L) 03/24/2021      Imaging Studies: MR Abdomen W Wo Contrast  Result Date: 09/13/2021 CLINICAL DATA:  Follow-up pancreatic cyst EXAM: MRI ABDOMEN WITHOUT AND WITH CONTRAST TECHNIQUE: Multiplanar multisequence MR imaging of the abdomen was performed both before and after the administration of intravenous contrast. CONTRAST:  6mL GADAVIST GADOBUTROL 1 MMOL/ML IV SOLN COMPARISON:  09/05/2020, 09/11/2019, 10/05/2018 FINDINGS: Lower chest: No acute findings. Hepatobiliary: No mass or other parenchymal abnormality identified. No gallstones. No biliary ductal dilatation. Pancreas: Unchanged 0.5 cm cystic lesion of the pancreatic uncinate (series 4, image 22). No solid mass, inflammatory changes, or other parenchymal abnormality identified. No pancreatic ductal dilatation. Spleen:  Within normal limits in size and appearance. Adrenals/Urinary Tract: No masses identified. No evidence of hydronephrosis. Stomach/Bowel: Visualized portions within the abdomen are unremarkable. Vascular/Lymphatic: No pathologically enlarged lymph nodes identified. No abdominal aortic aneurysm demonstrated. Other:  None. Musculoskeletal: No suspicious bone lesions identified. IMPRESSION: Unchanged 0.5 cm cystic lesion of the pancreatic uncinate. 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 As there is no observed increased risk of malignancy for such lesions smaller than 2 cm, particularly given well established initial stability, no further follow-up or characterization is required. Electronically Signed   By: Delanna Ahmadi M.D.   On: 09/13/2021 10:57   MR 3D Recon At Scanner  Result Date: 09/13/2021 CLINICAL DATA:  Follow-up pancreatic cyst EXAM: MRI ABDOMEN WITHOUT AND WITH CONTRAST TECHNIQUE: Multiplanar multisequence MR imaging of the abdomen was performed both before and after the administration of intravenous contrast. CONTRAST:  78mL  GADAVIST GADOBUTROL 1 MMOL/ML IV SOLN COMPARISON:  09/05/2020, 09/11/2019, 10/05/2018 FINDINGS: Lower chest: No acute findings. Hepatobiliary: No mass or other parenchymal abnormality identified. No gallstones. No biliary ductal dilatation. Pancreas: Unchanged 0.5 cm cystic lesion of the pancreatic uncinate (series 4, image 22). No solid mass, inflammatory changes, or other parenchymal abnormality identified. No pancreatic ductal dilatation. Spleen:  Within normal limits in size and appearance. Adrenals/Urinary Tract: No masses identified. No evidence of hydronephrosis. Stomach/Bowel: Visualized portions within the abdomen are unremarkable. Vascular/Lymphatic: No pathologically enlarged lymph nodes identified. No abdominal aortic aneurysm demonstrated. Other:  None. Musculoskeletal: No suspicious bone lesions identified. IMPRESSION: Unchanged 0.5 cm cystic lesion of the pancreatic uncinate. 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 As there is no observed increased risk of malignancy for such lesions smaller than 2 cm, particularly given well established initial stability, no further follow-up or characterization is required. Electronically Signed   By: Delanna Ahmadi M.D.   On: 09/13/2021 10:57     Assessment:  Pancreatic cyst: Has been stable for 3 years.  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  Based on guidelines, we will continue to follow yearly for total of 5 years, then every 2 years x 2, stopping at that time if  remains stable.  Patient's daughter was diagnosed with a rare form of pancreatic cancer at age 67 a couple of years ago, required resection only.  She will try to get further information about types of pancreatic cancer as it may affect her surveillance.  Barrett's esophagus: Due for EGD in January 2024.  Leukocytosis/thrombocytopenia: Noted during recent ED evaluation.  We will repeat labs now.  Uterine fibroids: Follow-up with  gynecology as she is overdue for examination.  Plan: CBC  Return to the office in 1 year.  She will be due for MRI abdomen with and without contrast as well as upper endoscopy at that time. We will try to get further information regarding the type of pancreatic cancer that her daughter has had.

## 2021-09-21 NOTE — Patient Instructions (Signed)
At your convenience, please go to [facility] for labs. We will be in touch with results as available. We will see you back in one year. At that time you will be due next MRI pancreas and upper endoscopy for your history of Barrett's esophagus.  Please see if you can find out what kind of pancreatic cancer your daughter had so we can make sure we are following proper guidelines for you given family history. ?adenocarcinoma or carcinoid tumor or other?

## 2021-09-23 DIAGNOSIS — K2270 Barrett's esophagus without dysplasia: Secondary | ICD-10-CM | POA: Diagnosis not present

## 2021-09-23 DIAGNOSIS — D696 Thrombocytopenia, unspecified: Secondary | ICD-10-CM | POA: Diagnosis not present

## 2021-09-23 DIAGNOSIS — K869 Disease of pancreas, unspecified: Secondary | ICD-10-CM | POA: Diagnosis not present

## 2021-09-23 DIAGNOSIS — D72819 Decreased white blood cell count, unspecified: Secondary | ICD-10-CM | POA: Diagnosis not present

## 2021-09-24 LAB — CBC WITH DIFFERENTIAL/PLATELET
Absolute Monocytes: 321 cells/uL (ref 200–950)
Basophils Absolute: 40 cells/uL (ref 0–200)
Basophils Relative: 0.9 %
Eosinophils Absolute: 101 cells/uL (ref 15–500)
Eosinophils Relative: 2.3 %
HCT: 36.9 % (ref 35.0–45.0)
Hemoglobin: 12.5 g/dL (ref 11.7–15.5)
Lymphs Abs: 1034 cells/uL (ref 850–3900)
MCH: 30.8 pg (ref 27.0–33.0)
MCHC: 33.9 g/dL (ref 32.0–36.0)
MCV: 90.9 fL (ref 80.0–100.0)
MPV: 11.3 fL (ref 7.5–12.5)
Monocytes Relative: 7.3 %
Neutro Abs: 2904 cells/uL (ref 1500–7800)
Neutrophils Relative %: 66 %
Platelets: 221 10*3/uL (ref 140–400)
RBC: 4.06 10*6/uL (ref 3.80–5.10)
RDW: 11.8 % (ref 11.0–15.0)
Total Lymphocyte: 23.5 %
WBC: 4.4 10*3/uL (ref 3.8–10.8)

## 2022-03-17 DIAGNOSIS — F329 Major depressive disorder, single episode, unspecified: Secondary | ICD-10-CM | POA: Diagnosis not present

## 2022-03-17 DIAGNOSIS — F419 Anxiety disorder, unspecified: Secondary | ICD-10-CM | POA: Diagnosis not present

## 2022-03-17 DIAGNOSIS — Z6823 Body mass index (BMI) 23.0-23.9, adult: Secondary | ICD-10-CM | POA: Diagnosis not present

## 2022-03-17 DIAGNOSIS — M1991 Primary osteoarthritis, unspecified site: Secondary | ICD-10-CM | POA: Diagnosis not present

## 2022-07-13 ENCOUNTER — Ambulatory Visit
Admission: EM | Admit: 2022-07-13 | Discharge: 2022-07-13 | Disposition: A | Discharge status: Home / Self Care | Payer: BC Managed Care – PPO | Attending: Nurse Practitioner | Admitting: Nurse Practitioner

## 2022-07-13 DIAGNOSIS — R21 Rash and other nonspecific skin eruption: Secondary | ICD-10-CM

## 2022-07-13 MED ORDER — VALACYCLOVIR HCL 1 G PO TABS: 1000.0000 mg | ORAL_TABLET | Freq: Three times a day (TID) | ORAL | 0 refills | Status: AC

## 2022-07-13 MED ORDER — TRIAMCINOLONE ACETONIDE 0.1 % EX OINT: 1.0000 | TOPICAL_OINTMENT | Freq: Two times a day (BID) | CUTANEOUS | 0 refills | Status: DC

## 2022-07-13 NOTE — Discharge Instructions (Signed)
-   Please start the valtrex 1000 mg and take it every 8 hours for the rash - You can use the topical ointment twice daily and this should help with the itching - Follow up with PCP if symptoms persist or worsen despite treatment

## 2022-07-13 NOTE — ED Provider Notes (Signed)
[facility]    CSN: [id number] Arrival date & time: 07/13/22  1451      History   Chief Complaint Chief Complaint  Patient presents with   Rash    HPI Vanessa Stokes is a 61 y.o. female.   Patient presents with rash to her right low back that has been present for the past week or so.  Reports the rash is red and itchy.  She denies any oozing, blisters, or pain associated with the rash.  Also denies any change in detergents, soaps, personal care products recently.  No fevers or nausea/vomiting, shortness of breath, throat or tongue swelling, new muscle pain or joint aches.  She reports a history of shingles and reports this feels like she was when she had it last time.    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[place])    Anemia    Bloated abdomen 05/12/2015   Depression    Paxil   Dysmenorrhea 05/12/2015   Fatigue 05/12/2015   Fibroid 05/15/2015   Fibroids 05/19/2015   Menometrorrhagia 05/12/2015   Pancreatic lesion 11/30/2017   Density in pancreatic head needs F/U in 1 year    Postmenopausal 05/15/2015   Postmenopausal bleeding 05/19/2015   Vaginal wall cyst 05/12/2015    Patient Active Problem List   Diagnosis Date Noted   Thrombocytopenia ([place]) 09/21/2021   Leukopenia 09/21/2021   Elevated lipase 10/03/2019   LLQ fullness 10/03/2019   Barrett's esophagus 10/03/2019   Pancreatic lesion 11/30/2017   Urinary frequency 11/30/2017   Cystocele without uterine prolapse 11/30/2017   Fibroids 05/19/2015   Postmenopausal bleeding 05/19/2015   Fibroid 05/15/2015   Postmenopausal 05/15/2015   Menometrorrhagia 05/12/2015   Dysmenorrhea 05/12/2015   Bloated abdomen 05/12/2015   Fatigue 05/12/2015   Vaginal wall cyst 05/12/2015   Anemia 01/02/2014   Other dysphagia 01/02/2014    Past Surgical History:  Procedure Laterality Date   BIOPSY  12/07/2019   Procedure: BIOPSY;  Surgeon: Daneil Dolin, MD;  [facility];  Service: Endoscopy;;    BRAIN SURGERY     COLONOSCOPY  12/04/2008   Dr. Rourk:Friable anal canal and anal papilla, otherwise normal rectum/Left-sided diverticula polyp at the splenic flexure, status post cold snared.  The remainder of the colonic mucosa and terminal  ileum mucosa appeared normal. BENIGN polyp.    COLONOSCOPY, ESOPHAGOGASTRODUODENOSCOPY (EGD) AND ESOPHAGEAL DILATION N/A 01/28/2014   Dr. Gala Romney: Erosive reflux esophagitis, biopsy-proven short segment Barrett's esophagus with no dysplasia, status post Maloney dilation, hernia, gastric erosions with benign biopsy/no H. pylori, benign small bowel biopsies.  Normal ileocolonoscopy.  Plans to repeat EGD in 1 year and colonoscopy in 10 years.   ESOPHAGOGASTRODUODENOSCOPY N/A 12/07/2019   Rourk: Barrett's esophagus with no dysplasia, noncritical Schatzki ring not manipulated.  Next EGD in January 2024.   pituitary gland tumors removed     X 2    OB History     Gravida  2   Para  1   Term      Preterm      AB  1   Living  1      SAB  1   IAB      Ectopic      Multiple      Live Births               Home Medications    Prior to Admission medications   Medication Sig Start Date End Date Taking? Authorizing  Provider  triamcinolone ointment (KENALOG) 0.1 % Apply 1 Application topically 2 (two) times daily. 07/13/22  Yes Eulogio Bear, NP  valACYclovir (VALTREX) 1000 MG tablet Take 1 tablet (1,000 mg total) by mouth every 8 (eight) hours for 7 days. 07/13/22 07/20/22 Yes Eulogio Bear, NP  ALPRAZolam Duanne Moron) 0.5 MG tablet Take 0.5 mg by mouth daily as needed for anxiety.    [provider]  diphenhydramine-acetaminophen (TYLENOL PM) 25-500 MG TABS tablet Take 1 tablet by mouth at bedtime as needed (sleep).    [provider]  PARoxetine (PAXIL) 20 MG tablet Take 20 mg by mouth every morning.    [provider]    Family History Family History  Problem Relation Age of Onset   Cancer Mother         lung   Cancer Father        lung   Other Daughter        hip problems; degenerative hip dysplasia   Pancreatic cancer Daughter        pancreatic tail, age 22.   Colon cancer Neg Hx     Social History Social History   Tobacco Use   Smoking status: Every Day    Packs/day: 0.50    Types: Cigarettes   Smokeless tobacco: Never   Tobacco comments:    Smokes about 3 cigarettes daily  Substance Use Topics   Alcohol use: Yes    Alcohol/week: 0.0 standard drinks of alcohol    Comment: drank heavily from age [age]. rare etoh since then   Drug use: No     Allergies   Patient has no known allergies.   Review of Systems Review of Systems Per HPI  Physical Exam Triage Vital Signs ED Triage Vitals  Enc Vitals Group     BP 07/13/22 1524 108/71     Pulse Rate 07/13/22 1524 85     Resp 07/13/22 1524 16     Temp 07/13/22 1524 99 F (37.2 C)     Temp Source 07/13/22 1524 Oral     SpO2 07/13/22 1524 98 %     Weight --      Height --      Head Circumference --      Peak Flow --      Pain Score 07/13/22 1523 5     Pain Loc --      Pain Edu? --      Excl. in [place]? --    No data found.  Updated Vital Signs BP 108/71 (BP Location: Right Arm)   Pulse 85   Temp 99 F (37.2 C) (Oral)   Resp 16   LMP 05/05/2015   SpO2 98%   Visual Acuity Right Eye Distance:   Left Eye Distance:   Bilateral Distance:    Right Eye Near:   Left Eye Near:    Bilateral Near:     Physical Exam Vitals and nursing note reviewed.  Constitutional:      General: She is not in acute distress.    Appearance: Normal appearance. She is not toxic-appearing.  HENT:     Head: Normocephalic and atraumatic.     Mouth/Throat:     Mouth: Mucous membranes are moist.     Pharynx: Oropharynx is clear.  Pulmonary:     Effort: Pulmonary effort is normal. No respiratory distress.  Skin:    General: Skin is warm and dry.     Capillary Refill: Capillary refill takes less than 2  seconds.     Findings: Rash  present. Rash is papular. Rash is not macular.          Comments: Distinct, erythematous papules to right lower posterior trunk and approximately area marked.  No vesicular lesions, no blisters, no oozing.  No surrounding erythema, fluctuance, or warmth.  No active drainage.  Neurological:     Mental Status: She is alert and oriented to person, place, and time.  Psychiatric:        Behavior: Behavior is cooperative.      UC Treatments / Results  Labs (all labs ordered are listed, but only abnormal results are displayed) Labs Reviewed - No data to display  EKG   Radiology No results found.  Procedures Procedures (including critical care time)  Medications Ordered in UC Medications - No data to display  Initial Impression / Assessment and Plan / UC Course  I have reviewed the triage vital signs and the nursing notes.  Pertinent labs & imaging results that were available during my care of the patient were reviewed by me and considered in my medical decision making (see chart for details).    Patient is a very pleasant, well-appearing 61 year old female presenting for rash.  In triage, vital signs are stable, she is normotensive, afebrile, not tachycardic, oxygenating well on room air.  She appears in no acute distress.  On examination, rashes consistent with a contact dermatitis.  Rash does not appear vesicular, is not consistent with shingles and I discussed this with the patient.  However, since this is what her left shingles rash felt like, we will go ahead and start on the Valtrex 1000 mg every 8 hours for 7 days to treat for possible shingles.  She can also use topical corticosteroid ointment to the itchy area twice daily to help with the itching associated with the rash.  ER precautions discussed.  Follow-up with primary care provider if symptoms not improved with treatment.  The patient was given the opportunity to ask questions.  All questions answered to their satisfaction.   The patient is in agreement to this plan.   Final Clinical Impressions(s) / UC Diagnoses   Final diagnoses:  Rash and nonspecific skin eruption     Discharge Instructions      - Please start the valtrex 1000 mg and take it every 8 hours for the rash - You can use the topical ointment twice daily and this should help with the itching - Follow up with PCP if symptoms persist or worsen despite treatment    ED Prescriptions     Medication Sig Dispense Auth. Provider   valACYclovir (VALTREX) 1000 MG tablet Take 1 tablet (1,000 mg total) by mouth every 8 (eight) hours for 7 days. 21 tablet Noemi Chapel A, NP   triamcinolone ointment (KENALOG) 0.1 % Apply 1 Application topically 2 (two) times daily. 15 g Eulogio Bear, NP      PDMP not reviewed this encounter.   Eulogio Bear, NP 07/13/22 1556

## 2022-07-13 NOTE — ED Triage Notes (Signed)
Pt reports painful and itching rash in right sided of back x 1 week; fatigue x 2 weeks.

## 2022-07-22 ENCOUNTER — Encounter: Payer: Self-pay | Admitting: Obstetrics & Gynecology

## 2022-07-22 ENCOUNTER — Other Ambulatory Visit (HOSPITAL_COMMUNITY)
Admission: RE | Admit: 2022-07-22 | Discharge: 2022-07-22 | Disposition: A | Discharge status: Home / Self Care | Payer: BC Managed Care – PPO | Source: Ambulatory Visit | Attending: Obstetrics & Gynecology | Admitting: Obstetrics & Gynecology

## 2022-07-22 ENCOUNTER — Ambulatory Visit (INDEPENDENT_AMBULATORY_CARE_PROVIDER_SITE_OTHER): Payer: BC Managed Care – PPO | Admitting: Obstetrics & Gynecology

## 2022-07-22 VITALS — BP 97/60 | HR 75 | Ht 64.0 in | Wt 142.4 lb

## 2022-07-22 DIAGNOSIS — Z1231 Encounter for screening mammogram for malignant neoplasm of breast: Secondary | ICD-10-CM | POA: Diagnosis not present

## 2022-07-22 DIAGNOSIS — N8110 Cystocele, unspecified: Secondary | ICD-10-CM | POA: Diagnosis not present

## 2022-07-22 DIAGNOSIS — Z01419 Encounter for gynecological examination (general) (routine) without abnormal findings: Secondary | ICD-10-CM | POA: Insufficient documentation

## 2022-07-22 DIAGNOSIS — Z01411 Encounter for gynecological examination (general) (routine) with abnormal findings: Secondary | ICD-10-CM | POA: Diagnosis not present

## 2022-07-22 DIAGNOSIS — N3946 Mixed incontinence: Secondary | ICD-10-CM | POA: Diagnosis not present

## 2022-07-22 NOTE — Patient Instructions (Signed)
Please schedule a mammogram at one of the following locations:  [place]: [phone]  [facility] :[phone] [address]  

## 2022-07-22 NOTE — Progress Notes (Signed)
WELL-WOMAN EXAMINATION Patient name: Vanessa Stokes MRN [id number]  Date of birth: 02/08/1961 Chief Complaint:   Gynecologic Exam  History of Present Illness:   Vanessa Stokes is a 61 y.o. G24P0011 female being seen today for a routine well-woman exam and the following concerns:  Prolapse:  By the end of the day, she notes a "rubber ball" coming out of her vagina.  Previously had pessary, but it kept falling out. She has not used it for quite some time, but interested in restarting.  Notes urinary frequency and when she voids only a small amount.  Denies splinting.  She also reports mixed incontinence.  Denies vaginal bleeding, itching or irritation. No other acute complaints  Patient's last menstrual period was 05/05/2015. postmenopausal   Last pap 2016.  Last mammogram: ordered today. Last colonoscopy: 2015     07/22/2022    8:51 AM 12/02/2017    8:46 AM  Depression screen PHQ 2/9  Decreased Interest 0 0  Down, Depressed, Hopeless 0 0  PHQ - 2 Score 0 0  Altered sleeping 3 3  Tired, decreased energy 2 3  Change in appetite 0 0  Feeling bad or failure about yourself  0 0  Trouble concentrating 2 1  Moving slowly or fidgety/restless 0 0  Suicidal thoughts 0 0  PHQ-9 Score 7 7  Difficult doing work/chores  Not difficult at all      Review of Systems:   Pertinent items are noted in HPI Denies any headaches, blurred vision, fatigue, shortness of breath, chest pain, abdominal pain. Pertinent History Reviewed:  Reviewed past medical,surgical, social and family history.  Reviewed problem list, medications and allergies. Physical Assessment:   Vitals:   07/22/22 0825  BP: 97/60  Pulse: 75  Weight: 142 lb 6.4 oz (64.6 kg)  Height: '5\' 4"'$  (1.626 m)  Body mass index is 24.44 kg/m.        Physical Examination:   General appearance - well appearing, and in no distress  Mental status - alert, oriented to person, place, and time  Psych:  She has a normal mood and  affect  Skin - warm and dry, normal color, no suspicious lesions noted  Chest - effort normal, all lung fields clear to auscultation bilaterally  Heart - normal rate and regular rhythm  Neck:  midline trachea, no thyromegaly or nodules  Breasts - breasts appear normal, no suspicious masses, no skin or nipple changes or  axillary nodes  Abdomen - soft, nontender, nondistended, no masses or organomegaly  Pelvic - VULVA: normal appearing vulva with no masses, tenderness or lesions  VAGINA: normal appearing vagina- pale mucosa, no abnormal discharge or lesions  CERVIX: normal appearing cervix without discharge or lesions, no CMT.  With Valsalva Stage 2 cystocele noted, no uterine prolapse, no rectocele appreciated  Thin prep pap is done with HR HPV cotesting  UTERUS: uterus is felt to be normal size, shape, consistency and nontender   ADNEXA: No adnexal masses or tenderness noted.  Extremities:  No swelling or varicosities noted, no calf tenderness bilaterally  Chaperone:  pt declined     PESSARY FITTING -examination as above -pt fitted with for pessary today -Milex Ring #4 placed without difficulty.  Pt reported good support without slippage  Assessment & Plan:  1) Well-Woman Exam -pap collected -mammogram ordered  2) Cystocele, mixed incontinence -will r/o underlying infection -reviewed conservative therapy and encouraged pt to cut back on caffeine- she reports multiple cups of coffee per day -  discussed pessary re-fitting, completed today as above -pt fitted for Milex ring #4 and given one today -she plans to remove/replace on her own prn -f/u in 61mo or sooner with any issues  Orders Placed This Encounter  Procedures   Urine Culture   MM 3D SCREEN BREAST BILATERAL   Urinalysis, Routine w reflex microscopic    Follow-up: Return in about 3 months (around 10/21/2022) for pessary follow up- Emeterio Balke/Eure, please print AVS.   JJanyth Pupa DO Attending [place]  [place]

## 2022-07-23 LAB — URINALYSIS, ROUTINE W REFLEX MICROSCOPIC
Bilirubin, UA: NEGATIVE
Glucose, UA: NEGATIVE
Ketones, UA: NEGATIVE
Nitrite, UA: NEGATIVE
Protein,UA: NEGATIVE
RBC, UA: NEGATIVE
Specific Gravity, UA: 1.015 (ref 1.005–1.030)
Urobilinogen, Ur: 0.2 mg/dL (ref 0.2–1.0)
pH, UA: 7.5 (ref 5.0–7.5)

## 2022-07-23 LAB — MICROSCOPIC EXAMINATION
Bacteria, UA: NONE SEEN
Casts: NONE SEEN /lpf
Epithelial Cells (non renal): NONE SEEN /hpf (ref 0–10)
RBC, Urine: NONE SEEN /hpf (ref 0–2)
WBC, UA: NONE SEEN /hpf (ref 0–5)

## 2022-07-24 LAB — URINE CULTURE

## 2022-07-28 ENCOUNTER — Telehealth: Payer: Self-pay

## 2022-07-28 LAB — CYTOLOGY - PAP
Comment: NEGATIVE
Diagnosis: UNDETERMINED — AB
High risk HPV: NEGATIVE

## 2022-07-28 NOTE — Telephone Encounter (Signed)
Pt would like to speak with someone about her test results

## 2022-07-28 NOTE — Telephone Encounter (Signed)
Mychart message sent by Dr Nelda Marseille and reviewed by patient.

## 2022-08-11 ENCOUNTER — Encounter: Payer: Self-pay | Admitting: *Deleted

## 2022-08-13 ENCOUNTER — Encounter: Payer: Self-pay | Admitting: Obstetrics & Gynecology

## 2022-10-05 DIAGNOSIS — R319 Hematuria, unspecified: Secondary | ICD-10-CM | POA: Diagnosis not present

## 2022-10-05 DIAGNOSIS — Z6824 Body mass index (BMI) 24.0-24.9, adult: Secondary | ICD-10-CM | POA: Diagnosis not present

## 2022-10-05 DIAGNOSIS — N390 Urinary tract infection, site not specified: Secondary | ICD-10-CM | POA: Diagnosis not present

## 2022-10-20 DIAGNOSIS — N390 Urinary tract infection, site not specified: Secondary | ICD-10-CM | POA: Diagnosis not present

## 2022-10-20 DIAGNOSIS — R319 Hematuria, unspecified: Secondary | ICD-10-CM | POA: Diagnosis not present

## 2022-10-21 ENCOUNTER — Ambulatory Visit: Payer: BC Managed Care – PPO | Admitting: Obstetrics & Gynecology

## 2022-10-21 DIAGNOSIS — N390 Urinary tract infection, site not specified: Secondary | ICD-10-CM | POA: Diagnosis not present

## 2022-11-03 ENCOUNTER — Encounter: Payer: Self-pay | Admitting: *Deleted

## 2022-11-03 ENCOUNTER — Other Ambulatory Visit (HOSPITAL_COMMUNITY): Payer: Self-pay | Admitting: Family Medicine

## 2022-11-03 DIAGNOSIS — Z01411 Encounter for gynecological examination (general) (routine) with abnormal findings: Secondary | ICD-10-CM

## 2022-11-03 DIAGNOSIS — Z1231 Encounter for screening mammogram for malignant neoplasm of breast: Secondary | ICD-10-CM

## 2022-11-03 DIAGNOSIS — N8110 Cystocele, unspecified: Secondary | ICD-10-CM

## 2022-11-03 DIAGNOSIS — N3946 Mixed incontinence: Secondary | ICD-10-CM

## 2022-11-04 DIAGNOSIS — Z8639 Personal history of other endocrine, nutritional and metabolic disease: Secondary | ICD-10-CM | POA: Diagnosis not present

## 2022-11-04 DIAGNOSIS — E220 Acromegaly and pituitary gigantism: Secondary | ICD-10-CM | POA: Diagnosis not present

## 2022-11-10 ENCOUNTER — Ambulatory Visit: Payer: Self-pay | Admitting: Urology

## 2022-11-11 ENCOUNTER — Ambulatory Visit (HOSPITAL_COMMUNITY)
Admission: RE | Admit: 2022-11-11 | Discharge: 2022-11-11 | Disposition: A | Discharge status: Home / Self Care | Payer: BC Managed Care – PPO | Source: Ambulatory Visit | Attending: Family Medicine | Admitting: Family Medicine

## 2022-11-11 DIAGNOSIS — Z1231 Encounter for screening mammogram for malignant neoplasm of breast: Secondary | ICD-10-CM | POA: Diagnosis not present

## 2022-11-30 ENCOUNTER — Ambulatory Visit: Payer: BC Managed Care – PPO | Admitting: Urology

## 2022-11-30 ENCOUNTER — Encounter: Payer: Self-pay | Admitting: Urology

## 2022-11-30 VITALS — BP 113/77 | HR 87

## 2022-11-30 DIAGNOSIS — N3941 Urge incontinence: Secondary | ICD-10-CM

## 2022-11-30 DIAGNOSIS — N8110 Cystocele, unspecified: Secondary | ICD-10-CM

## 2022-11-30 DIAGNOSIS — N390 Urinary tract infection, site not specified: Secondary | ICD-10-CM

## 2022-11-30 DIAGNOSIS — R3915 Urgency of urination: Secondary | ICD-10-CM

## 2022-11-30 DIAGNOSIS — R350 Frequency of micturition: Secondary | ICD-10-CM

## 2022-11-30 DIAGNOSIS — Z87440 Personal history of urinary (tract) infections: Secondary | ICD-10-CM

## 2022-11-30 LAB — BLADDER SCAN AMB NON-IMAGING: Scan Result: 79

## 2022-11-30 NOTE — Progress Notes (Signed)
Pt here today for bladder scan. Bladder was scanned and 79 was visualized.   Performed by Marisue Brooklyn, CMA

## 2022-11-30 NOTE — Progress Notes (Signed)
H&P  Chief Complaint: Urinary frequency  History of Present Illness: Vanessa Stokes is a 62 y.o. year old female referred by Dr. Hilma Favors for evaluation and management of lower urinary tract symptoms.  She is seen by Dr. Nelda Marseille for gynecologic management.  She has been on a pessary since late August 2023.  She does have bladder prolapse.  That apparently does help with her prolapse although she has a hard time retaining the pessary at times.  Before the pessary was placed, she had a history of urinary frequency, urgency as well as urgency incontinence.  Less common stress urinary incontinence.  Symptoms are bothersome.  She does drink a fair amount of coffee.  She smokes cigarettes.  She is not currently sexually active.  She does have history of recent urinary tract infections, although not a long history of these.  No prior urologic history.  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 Rourk:Friable anal canal and anal papilla, otherwise normal rectum/Left-sided diverticula polyp at the splenic flexure, status post cold snared.  The remainder of the colonic mucosa and terminal  ileum mucosa appeared normal. BENIGN polyp.    COLONOSCOPY, ESOPHAGOGASTRODUODENOSCOPY (EGD) AND ESOPHAGEAL DILATION N/A 01/28/2014   Dr. Gala Romney: Erosive reflux esophagitis, biopsy-proven short segment Barrett's esophagus with no  dysplasia, status post Maloney dilation, hernia, gastric erosions with benign biopsy/no H. pylori, benign small bowel biopsies.  Normal ileocolonoscopy.  Plans to repeat EGD in 1 year and colonoscopy in 10 years.   ESOPHAGOGASTRODUODENOSCOPY N/A 12/07/2019   Rourk: Barrett's esophagus with no dysplasia, noncritical Schatzki ring not manipulated.  Next EGD in January 2024.   pituitary gland tumors removed     X 2    Home Medications:  (Not in a hospital admission)   Allergies: No Known Allergies  Family History  Problem Relation Age of Onset   Cancer Mother        lung   Cancer Father        lung   Other Daughter        hip problems; degenerative hip dysplasia   Pancreatic cancer Daughter        pancreatic tail, age 32.   Colon cancer Neg Hx     Social History:  reports that she has been smoking cigarettes. She has been smoking an average of .5 packs per day. She has never used smokeless tobacco. She reports current alcohol use. She reports that she does not use drugs.  ROS: A complete review of systems was performed.  All systems are negative except for pertinent findings as noted.  Physical Exam:  Vital signs in last 24 hours: '@VSRANGES'$ @ General:  Alert and oriented, No acute distress HEENT: Normocephalic, atraumatic Neck: No JVD or lymphadenopathy Cardiovascular: Regular rate  Lungs: Normal inspiratory/expiratory excursion Extremities: No edema Neurologic: Grossly  intact  I have reviewed prior pt notes  I have reviewed notes from referring/previous physicians  I have reviewed urinalysis results  I reviewed CT images from her CT stone protocol from 2022  Urine culture results from August, 2023 reviewed-negative   Impression/Assessment:  Overactive bladder symptoms with urgency incontinence  Bladder prolapse, using a pessary  History of recent urinary tract infections  Plan:  1.  Overactive bladder guide sheet given  2.  She was given 6 weeks of Myrbetriq  25 mg  3.  I will have her come back in 6 weeks to recheck symptoms  Jorja Loa 11/30/2022, 8:28 AM  Lillette Boxer. Jeanita Carneiro MD

## 2022-12-01 ENCOUNTER — Ambulatory Visit: Payer: BC Managed Care – PPO

## 2022-12-08 ENCOUNTER — Telehealth: Payer: Self-pay

## 2022-12-08 NOTE — Telephone Encounter (Signed)
Tried calling patient with no answer. Left VM for return call 

## 2022-12-08 NOTE — Telephone Encounter (Signed)
Open in error

## 2023-01-11 ENCOUNTER — Ambulatory Visit: Payer: BC Managed Care – PPO | Admitting: Urology

## 2023-03-19 DIAGNOSIS — Z6824 Body mass index (BMI) 24.0-24.9, adult: Secondary | ICD-10-CM | POA: Diagnosis not present

## 2023-03-19 DIAGNOSIS — M545 Low back pain, unspecified: Secondary | ICD-10-CM | POA: Diagnosis not present

## 2023-03-19 DIAGNOSIS — J069 Acute upper respiratory infection, unspecified: Secondary | ICD-10-CM | POA: Diagnosis not present

## 2023-03-23 ENCOUNTER — Telehealth: Payer: Self-pay

## 2023-03-23 NOTE — Telephone Encounter (Signed)
Patient would like for Jennifer's nurse to call her she has some concerns

## 2023-03-23 NOTE — Telephone Encounter (Signed)
Pt's bladder has dropped. She has a pessary. It keeps coming out. Pt states she saw blood yesterday. Advised she needs an appt. Call transferred to [facility] for an appt. JSY

## 2023-03-24 ENCOUNTER — Encounter: Payer: Self-pay | Admitting: Adult Health

## 2023-03-24 ENCOUNTER — Ambulatory Visit: Payer: BC Managed Care – PPO | Admitting: Adult Health

## 2023-03-24 VITALS — BP 124/78 | HR 92 | Ht 64.0 in | Wt 138.0 lb

## 2023-03-24 DIAGNOSIS — Z4689 Encounter for fitting and adjustment of other specified devices: Secondary | ICD-10-CM | POA: Diagnosis not present

## 2023-03-24 DIAGNOSIS — N8110 Cystocele, unspecified: Secondary | ICD-10-CM | POA: Diagnosis not present

## 2023-03-24 DIAGNOSIS — N950 Postmenopausal bleeding: Secondary | ICD-10-CM | POA: Insufficient documentation

## 2023-03-24 DIAGNOSIS — N816 Rectocele: Secondary | ICD-10-CM | POA: Insufficient documentation

## 2023-03-24 NOTE — Progress Notes (Signed)
  Subjective:     Patient ID: Vanessa Stokes, female   DOB: 1961/07/02, 62 y.o.   MRN: [id number]  HPI Damiya is a 62 year old white female, divorced, PM in complaining of having vaginal bleeding and pessary keeps falling out, feels like something has dropped and had back pain last Saturday.  She left #4 pessary at home   Last pap was 07/22/22 ASCUS negative HPV.  PCP is Dr Phillips Odor.  Review of Systems +vaginal bleeding Something has fallen  Had back pain Reviewed past medical,surgical, social and family history. Reviewed medications and allergies.     Objective:   Physical Exam BP 124/78 (BP Location: Left Arm, Patient Position: Sitting, Cuff Size: Normal)   Pulse 92   Ht 5\' 4"  (1.626 m)   Wt 138 lb (62.6 kg)   LMP 05/05/2015   BMI 23.69 kg/m     Skin warm and dry.Pelvic: external genitalia is normal in appearance no lesions, vagina: pale,urethra has no lesions or masses noted, cervix:smooth, +pink at os, has some descensus, +cystocele, uterus: normal size, shape and contour, non tender, no masses felt, adnexa: no masses or tenderness noted. Bladder is non tender and no masses felt. Has + rectocele too.  Fitted with # 5 ring pessary  with support and she says it feels good. Fall risk is low  [facility] - 03/24/23 1509       Pregnancy Intention Screening   Does the patient want to become pregnant in the next year? N/A    Does the patient's partner want to become pregnant in the next year? N/A    Would the patient like to discuss contraceptive options today? N/A      Contraception Wrap Up   Current Method No Method - Other Reason   postmenopausal   Reason for No Current Contraceptive Method at Intake ([facility] Only) Other    End Method No Method - Other Reason   postmenopausal   Contraception Counseling Provided No            Examination chaperoned by Malachy Mood LPN  Assessment:     1. Pelvic organ prolapse quantification stage 2 cystocele   2. Rectocele  3. Encounter  for fitting and adjustment of pessary Fitted for # 5 ring with support pessary  4. PMB (postmenopausal bleeding) +bleeding Will get pelvic US to assess uterine lining and see me after  - US PELVIC COMPLETE WITH TRANSVAGINAL; Future     Plan:     Return in about 2 weeks for pelvic US and see me to reassess pessary

## 2023-03-29 ENCOUNTER — Telehealth: Payer: Self-pay

## 2023-03-29 NOTE — Telephone Encounter (Signed)
Tried calling patient on 5/6 & 5/7 to reschedule ultrasound appointment due to a scheduling conflict. Left message both days.

## 2023-04-05 ENCOUNTER — Encounter: Payer: Self-pay | Admitting: Adult Health

## 2023-04-05 ENCOUNTER — Ambulatory Visit (INDEPENDENT_AMBULATORY_CARE_PROVIDER_SITE_OTHER): Payer: BC Managed Care – PPO

## 2023-04-05 ENCOUNTER — Ambulatory Visit: Payer: BC Managed Care – PPO | Admitting: Adult Health

## 2023-04-05 ENCOUNTER — Other Ambulatory Visit: Payer: Self-pay | Admitting: Adult Health

## 2023-04-05 VITALS — BP 113/74 | HR 74 | Ht 64.0 in | Wt 138.0 lb

## 2023-04-05 DIAGNOSIS — N8110 Cystocele, unspecified: Secondary | ICD-10-CM

## 2023-04-05 DIAGNOSIS — N950 Postmenopausal bleeding: Secondary | ICD-10-CM

## 2023-04-05 DIAGNOSIS — Z4689 Encounter for fitting and adjustment of other specified devices: Secondary | ICD-10-CM

## 2023-04-05 DIAGNOSIS — N816 Rectocele: Secondary | ICD-10-CM

## 2023-04-05 DIAGNOSIS — D219 Benign neoplasm of connective and other soft tissue, unspecified: Secondary | ICD-10-CM

## 2023-04-05 NOTE — Progress Notes (Signed)
PELVIC US TV/TA:heterogeneous axial positioned uterus,mid/fundal ? submucosal fibroid 5.4 x 4.6 x 5.2 cm,normal ovaries,ovaries appear mobile,no pain during ultrasound,unable to visualized endometrium because of fibroid,no free fluid  Chaperone [facility]

## 2023-04-05 NOTE — Progress Notes (Signed)
  Subjective:     Patient ID: Vanessa Stokes, female   DOB: 1961-01-21, 62 y.o.   MRN: [id number]  HPI  Vanessa Stokes is a 62 year old white female, divorced, PM in for [place] for PMB and make sure pessary fitting better, had #5 ring fitted Mar 24, 2023 it has some out once with BM, she can put back in.      Component Value Date/Time   DIAGPAP (A) 07/22/2022 0823    - Atypical squamous cells of undetermined significance (ASC-US)   HPVHIGH Negative 07/22/2022 0823   ADEQPAP  07/22/2022 0823    Satisfactory for evaluation; transformation zone component PRESENT.   PCP is Dr Phillips Odor  Review of Systems She said pessary did come out once with BM Reviewed past medical,surgical, social and family history. Reviewed medications and allergies.     Objective:   Physical Exam BP 113/74 (BP Location: Left Arm, Patient Position: Sitting, Cuff Size: Normal)   Pulse 74   Ht 5\' 4"  (1.626 m)   Wt 138 lb (62.6 kg)   LMP 05/05/2015   BMI 23.69 kg/m     Skin warm and dry.Pelvic: external genitalia is normal in appearance no lesions, vagina: pessary feels like fits good  [facility] - 04/05/23 1436       Pregnancy Intention Screening   Does the patient want to become pregnant in the next year? N/A    Does the patient's partner want to become pregnant in the next year? N/A    Would the patient like to discuss contraceptive options today? N/A      Contraception Wrap Up   Current Method No Method - Other Reason   postmenopausal   Reason for No Current Contraceptive Method at Intake ([facility] Only) Other    End Method No Method - Other Reason   postmenopausal   Contraception Counseling Provided No            Examination chaperoned by Malachy Mood LPN Assessment:     1. PMB (postmenopausal bleeding) Had bleeding, could see endometrial lining of [place] due to fibroid Will get endometrial biopsy   2. Pelvic organ prolapse quantification stage 2 cystocele Has pessary   3. Fibroid Has 5 cm fibroid in endometrium  area, can not get measurement of endometrium      Plan:     Will get endometrial biopsy with Dr Charlotta Newton

## 2023-04-07 ENCOUNTER — Ambulatory Visit: Payer: BC Managed Care – PPO | Admitting: Obstetrics & Gynecology

## 2023-04-07 ENCOUNTER — Ambulatory Visit: Payer: BC Managed Care – PPO | Admitting: Adult Health

## 2023-04-07 ENCOUNTER — Other Ambulatory Visit: Payer: BC Managed Care – PPO

## 2023-04-07 ENCOUNTER — Other Ambulatory Visit (HOSPITAL_COMMUNITY)
Admission: RE | Admit: 2023-04-07 | Discharge: 2023-04-07 | Disposition: A | Discharge status: Home / Self Care | Payer: BC Managed Care – PPO | Source: Ambulatory Visit | Attending: Obstetrics & Gynecology | Admitting: Obstetrics & Gynecology

## 2023-04-07 ENCOUNTER — Encounter: Payer: Self-pay | Admitting: Obstetrics & Gynecology

## 2023-04-07 VITALS — BP 119/72 | HR 81 | Ht 64.0 in | Wt 139.8 lb

## 2023-04-07 DIAGNOSIS — R3129 Other microscopic hematuria: Secondary | ICD-10-CM

## 2023-04-07 DIAGNOSIS — Z960 Presence of urogenital implants: Secondary | ICD-10-CM

## 2023-04-07 DIAGNOSIS — N8110 Cystocele, unspecified: Secondary | ICD-10-CM

## 2023-04-07 DIAGNOSIS — N858 Other specified noninflammatory disorders of uterus: Secondary | ICD-10-CM | POA: Diagnosis not present

## 2023-04-07 DIAGNOSIS — R102 Pelvic and perineal pain: Secondary | ICD-10-CM | POA: Diagnosis not present

## 2023-04-07 DIAGNOSIS — D219 Benign neoplasm of connective and other soft tissue, unspecified: Secondary | ICD-10-CM | POA: Diagnosis not present

## 2023-04-07 DIAGNOSIS — N950 Postmenopausal bleeding: Secondary | ICD-10-CM | POA: Insufficient documentation

## 2023-04-07 NOTE — Addendum Note (Signed)
Addended by: Annamarie Dawley on: 04/07/2023 02:38 PM   Modules accepted: Orders

## 2023-04-07 NOTE — Progress Notes (Signed)
GYN VISIT Patient name: Vanessa Stokes MRN [id number]  Date of birth: 1960-12-09 Chief Complaint:   Procedure  History of Present Illness:   Vanessa Stokes is a 62 y.o. G37P0011 PM female being seen today for the following concerns:  PMB: She has noticed a little bit of blood probably that started about 3 mos ago.  Pink blood noted when she wiped.  Notes some mild pelvic discomfort for the past few days.    Urinary frequency.  Previously had pessary, but continued to slip out,  Seen by J.Griffin and transitioned to larger pessary, which so far has been doing a lot better for her.  Due to the bleeding, patient was sent for pelvic ultrasound  Ultrasound was reviewed and showed the following: 6.1 x 5.3 x 5.8 cm uterus with ?  Submucosal fibroid 5.4 x 4.6 x 5.2 cm.  Unable to visualize endometrium.  Normal ovaries bilaterally  Patient's last menstrual period was 05/05/2015.     07/22/2022    8:51 AM 12/02/2017    8:46 AM  Depression screen PHQ 2/9  Decreased Interest 0 0  Down, Depressed, Hopeless 0 0  PHQ - 2 Score 0 0  Altered sleeping 3 3  Tired, decreased energy 2 3  Change in appetite 0 0  Feeling bad or failure about yourself  0 0  Trouble concentrating 2 1  Moving slowly or fidgety/restless 0 0  Suicidal thoughts 0 0  PHQ-9 Score 7 7  Difficult doing work/chores  Not difficult at all     Review of Systems:   Pertinent items are noted in HPI Denies fever/chills, dizziness, headaches, visual disturbances, fatigue, shortness of breath, chest pain, abdominal pain, vomiting. Pertinent History Reviewed:  Reviewed past medical,surgical, social, obstetrical and family history.  Reviewed problem list, medications and allergies. Physical Assessment:   Vitals:   04/07/23 1328  BP: 119/72  Pulse: 81  Weight: 139 lb 12.8 oz (63.4 kg)  Height: 5\' 4"  (1.626 m)  Body mass index is 24 kg/m.       Physical Examination:   General appearance: alert, well appearing, and in no  distress  Psych: mood appropriate, normal affect  Skin: warm & dry   Cardiovascular: normal heart rate noted  Respiratory: normal respiratory effort, no distress  Abdomen: soft, non-tender, no rebound no guarding  Pelvic: VULVA: normal appearing vulva with no masses, tenderness or lesions, pessary removed.  VAGINA: normal appearing vagina with normal color and discharge, no lesions, CERVIX: normal appearing cervix, small nabothian cyst.  Extremities: no edema   Chaperone: Faith Rogue    Endometrial Biopsy Procedure Note  Pre-operative Diagnosis: Postmenopausal bleeding  Post-operative Diagnosis: same  Procedure Details  The risks (including infection, bleeding, pain, and uterine perforation) and benefits of the procedure were explained to the patient and Written informed consent was obtained.  Antibiotic prophylaxis against endocarditis was not indicated.   The patient was placed in the dorsal lithotomy position.  Bimanual exam showed the uterus to be in the neutral position.  A speculum inserted in the vagina, and the cervix prepped with betadine.     A single tooth tenaculum was applied to the anterior lip of the cervix for stabilization.  Os finder was used.  A Pipelle endometrial aspirator was used to sample the endometrium.  Sample was sent for pathologic examination.  Condition: Stable  Complications: None   Assessment & Plan:  1) postmenopausal bleeding next step pending results of pathology.   -The patient was  advised to call for any fever or for prolonged or severe pain or bleeding. She was advised to use OTC analgesics as needed for mild to moderate pain. She was advised to avoid vaginal intercourse for 48 hours or until the bleeding has completely stopped. -Advised to keep pessary out for 48 hours, once bleeding has slowed then can replace pessary  2) ?Hematuria -urine culture ordered -May likely be from pessary   Orders Placed This Encounter  Procedures    Urine Culture    Return for TBD.   Myna Hidalgo, DO Attending Obstetrician & Gynecologist, [facility] for [employer], [facility]

## 2023-04-11 LAB — SURGICAL PATHOLOGY

## 2023-04-11 LAB — URINE CULTURE

## 2023-04-12 ENCOUNTER — Telehealth: Payer: Self-pay | Admitting: Adult Health

## 2023-04-12 ENCOUNTER — Other Ambulatory Visit: Payer: Self-pay | Admitting: Obstetrics & Gynecology

## 2023-04-12 DIAGNOSIS — N3001 Acute cystitis with hematuria: Secondary | ICD-10-CM

## 2023-04-12 MED ORDER — NITROFURANTOIN MONOHYD MACRO 100 MG PO CAPS: 100.0000 mg | ORAL_CAPSULE | Freq: Two times a day (BID) | ORAL | 0 refills | Status: AC

## 2023-04-12 NOTE — Telephone Encounter (Signed)
Pt aware that biopsy was negative for malignancy, she is still bleeding a little since biopsy. If does not stop soon, let me know

## 2023-04-12 NOTE — Telephone Encounter (Signed)
Patient would like you to call her as soon as you hear something about her biopsy. Please advise.

## 2023-04-12 NOTE — Progress Notes (Signed)
Rx for UTI treatment  Myna Hidalgo, DO Attending Obstetrician & Gynecologist, [facility] for [employer], [facility]

## 2023-04-20 ENCOUNTER — Encounter: Payer: Self-pay | Admitting: *Deleted

## 2023-04-21 ENCOUNTER — Other Ambulatory Visit: Payer: BC Managed Care – PPO | Admitting: Obstetrics & Gynecology

## 2023-04-27 ENCOUNTER — Encounter: Payer: Self-pay | Admitting: Gastroenterology

## 2023-04-27 ENCOUNTER — Other Ambulatory Visit: Payer: Self-pay | Admitting: *Deleted

## 2023-04-27 ENCOUNTER — Encounter: Payer: Self-pay | Admitting: *Deleted

## 2023-04-27 ENCOUNTER — Telehealth: Payer: Self-pay | Admitting: *Deleted

## 2023-04-27 ENCOUNTER — Ambulatory Visit (INDEPENDENT_AMBULATORY_CARE_PROVIDER_SITE_OTHER): Payer: BC Managed Care – PPO | Admitting: Gastroenterology

## 2023-04-27 VITALS — BP 106/68 | HR 71 | Temp 98.4°F | Ht 64.0 in | Wt 141.4 lb

## 2023-04-27 DIAGNOSIS — K869 Disease of pancreas, unspecified: Secondary | ICD-10-CM

## 2023-04-27 DIAGNOSIS — K2270 Barrett's esophagus without dysplasia: Secondary | ICD-10-CM | POA: Diagnosis not present

## 2023-04-27 NOTE — Telephone Encounter (Signed)
Carelon PA for MRI: Order ID: [id number]       Authorized  Approval Valid Through: 04/27/2023 - 05/26/2023

## 2023-04-27 NOTE — Patient Instructions (Signed)
MRI pancreas to be scheduled. Upper endoscopy to be scheduled.

## 2023-04-27 NOTE — Progress Notes (Signed)
GI Office Note    Referring Provider: Assunta Found, MD Primary Care Physician:  Assunta Found, MD  Primary Gastroenterologist: Roetta Sessions, MD   Chief Complaint   Chief Complaint  Patient presents with   Follow-up    Here to follow up on pancreatic lesion    History of Present Illness   Vanessa Stokes is a 62 y.o. female presenting today for follow-up.  Last seen in October 2022.  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  Also with history of Barrett's esophagus.  Family history of rare pancreatic cancer, daughter at age 63, underwent resection of the pancreatic tail.  Clinically feels well.  No heartburn.  Has not been on PPI for years.  No dysphagia.  No constipation or diarrhea.  No melena or rectal bleeding.  She has been seeing the GYN for pelvic discomfort.  States she has a history of enlarged uterus with large fibroid tumor, recently underwent biopsy which was benign.  Has a history of pessary which does not stay in place and has been advised that she likely needs a hysterectomy.  EGD January 2021: -Salmon-colored mucosa -distal esophagus -consistant with history of biopsy-proven Barrett's esophagus?appeared to be a lesser degree than seen at prior EGD. Status post biopsy. -Hiatal hernia. Noncritical Schatzki's ring not manipulated. Normal stomach. Normal D1-D2. -Barrett's esophagus, negative for dysplasia. -Recommend repeat EGD in 3 years (January 2024).  Colonoscopy in March 2015: Normal ileocolonoscopy.  Next colonoscopy in 10 years.  Medications   Current Outpatient Medications  Medication Sig Dispense Refill   ALPRAZolam (XANAX) 0.5 MG tablet Take 0.5 mg by mouth daily as needed for anxiety.     diphenhydramine-acetaminophen (TYLENOL PM) 25-500 MG TABS tablet Take 1 tablet by mouth at bedtime as needed (sleep).     PARoxetine (PAXIL) 20 MG tablet Take 20 mg by mouth every morning.     No  current facility-administered medications for this visit.    Allergies   Allergies as of 04/27/2023   (No Known Allergies)     Past Medical History   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HCC)    Anemia    Bloated abdomen 05/12/2015   Depression    Paxil   Dysmenorrhea 05/12/2015   Fatigue 05/12/2015   Fibroid 05/15/2015   Fibroids 05/19/2015   Menometrorrhagia 05/12/2015   Pancreatic lesion 11/30/2017   Density in pancreatic head needs F/U in 1 year    Postmenopausal 05/15/2015   Postmenopausal bleeding 05/19/2015   Vaginal wall cyst 05/12/2015    Past Surgical History   Past Surgical History:  Procedure Laterality Date   BIOPSY  12/07/2019   Procedure: BIOPSY;  Surgeon: Corbin Ade, MD;  Location: [facility];  Service: Endoscopy;;   BRAIN SURGERY     COLONOSCOPY  12/04/2008   Dr. Rourk:Friable anal canal and anal papilla, otherwise normal rectum/Left-sided diverticula polyp at the splenic flexure, status post cold snared.  The remainder of the colonic mucosa and terminal  ileum mucosa appeared normal. BENIGN polyp.    COLONOSCOPY, ESOPHAGOGASTRODUODENOSCOPY (EGD) AND ESOPHAGEAL DILATION N/A 01/28/2014   Dr. Jena Gauss: Erosive reflux esophagitis, biopsy-proven short segment Barrett's esophagus with no dysplasia, status post Maloney dilation, hernia, gastric erosions with benign biopsy/no H. pylori, benign small bowel biopsies.  Normal ileocolonoscopy.  Plans to repeat EGD in 1 year and colonoscopy in 10 years.   ESOPHAGOGASTRODUODENOSCOPY N/A 12/07/2019   Rourk:  Barrett's esophagus with no dysplasia, noncritical Schatzki ring not manipulated.  Next EGD in January 2024.   pituitary gland tumors removed     X 2    Past Family History   Family History  Problem Relation Age of Onset   Cancer Mother        lung   Cancer Father        lung   Other Daughter        hip problems; degenerative hip dysplasia   Pancreatic cancer Daughter        pancreatic tail, age  63.   Colon cancer Neg Hx     Past Social History   Social History   Socioeconomic History   Marital status: Divorced    Spouse name: Not on file   Number of children: Not on file   Years of education: Not on file   Highest education level: Not on file  Occupational History   Occupation: [employer] doctor: [employer]  Tobacco Use   Smoking status: Every Day    Packs/day: .5    Types: Cigarettes   Smokeless tobacco: Never   Tobacco comments:    Smokes about 3 cigarettes daily  Vaping Use   Vaping Use: Never used  Substance and Sexual Activity   Alcohol use: Not Currently    Comment: drank heavily from age [age]. rare etoh since then   Drug use: No   Sexual activity: Not Currently    Birth control/protection: Post-menopausal  Other Topics Concern   Not on file  Social History Narrative   Not on file   Social Determinants of Health   Financial Resource Strain: Not on file  Food Insecurity: No Food Insecurity (07/22/2022)   Hunger Vital Sign    Worried About Running Out of Food in the Last Year: Never true    Ran Out of Food in the Last Year: Never true  Transportation Needs: No Transportation Needs (07/22/2022)   PRAPARE - Administrator, Civil Service (Medical): No    Lack of Transportation (Non-Medical): No  Physical Activity: Not on file  Stress: Not on file  Social Connections: Not on file  Intimate Partner Violence: Not At Risk (07/22/2022)   Humiliation, Afraid, Rape, and Kick questionnaire    Fear of Current or Ex-Partner: No    Emotionally Abused: No    Physically Abused: No    Sexually Abused: No    Review of Systems   General: Negative for anorexia, weight loss, fever, chills, fatigue, weakness. ENT: Negative for hoarseness, difficulty swallowing , nasal congestion. CV: Negative for chest pain, angina, palpitations, dyspnea on exertion, peripheral edema.  Respiratory: Negative for dyspnea at rest, dyspnea on exertion, cough, sputum,  wheezing.  GI: See history of present illness. GU:  Negative for dysuria, hematuria, urinary incontinence, urinary frequency, nocturnal urination.  See HPI Endo: Negative for unusual weight change.     Physical Exam   BP 106/68 (BP Location: Right Arm, Patient Position: Sitting, Cuff Size: Normal)   Pulse 71   Temp 98.4 F (36.9 C) (Oral)   Ht 5\' 4"  (1.626 m)   Wt 141 lb 6.4 oz (64.1 kg)   LMP 05/05/2015   SpO2 97%   BMI 24.27 kg/m    General: Well-nourished, well-developed in no acute distress.  Eyes: No icterus. Mouth: Oropharyngeal mucosa moist and pink   Lungs: Clear to auscultation bilaterally.  Heart: Regular rate and rhythm, no murmurs rubs or gallops.  Abdomen: Bowel sounds are normal, nontender, nondistended, no hepatosplenomegaly or masses,  no abdominal bruits or hernia , no rebound or guarding.  Rectal: Not performed Extremities: No lower extremity edema. No clubbing or deformities. Neuro: Alert and oriented x 4   Skin: Warm and dry, no jaundice.   Psych: Alert and cooperative, normal mood and affect.  Labs   none Imaging Studies   US PELVIS LIMITED (TRANSABDOMINAL ONLY)  Result Date: 04/06/2023 Images from the original result were not included.  ..an [employer] of [employer] Medicine Technical sales engineer) accredited practice Center for [facility] @ [facility] [address] [place] [address] Ordering Provider: Adline Potter, NP                                                                                           GYNECOLOGIC SONOGRAM AYME REINTS is a 62 y.o. [id number] Patient's last menstrual period was 05/05/2015. She is here for a pelvic sonogram for postmenopausal bleeding. Uterus                      6.1 x 5.3 x 5.8 cm, Total uterine volume 98 cc,heterogeneous axial positioned uterus,mid/fundal hypoechoic mass with calcifications,? submucosal fibroid 5.4 x 4.6 x 5.2 cm Endometrium          unable to visualized endometrium because of  fibroid Right ovary             1.7 x 1.1 x 1.8 cm, normal Left ovary                1.7 x 1 x 1.8 cm, normal No free fluid Technician Comments: PELVIC US TV/TA:heterogeneous axial positioned uterus,mid/fundal hypoechoic mass with calcifications,? submucosal fibroid 5.4 x 4.6 x 5.2 cm,normal ovaries,ovaries appear mobile,no pain during ultrasound,unable to visualized endometrium because of fibroid,no free fluid Chaperone [id number] Flora Lipps 04/05/2023 2:50 PM Clinical Impression and recommendations: I have reviewed the sonogram results above, combined with the patient's current clinical course, below are my impressions and any appropriate recommendations for management based on the sonographic findings. Normal uterine size and shape.  Within uterus hypoechoic mass, suggestive of submucosal fibroid measuring 5.4 x 4.6 x 5.2 cm. Unable to visualize endometrium due to uterine fibroid. Normal ovaries bilaterally. In the setting of postmenopausal bleeding, would advise further evaluation of the endometrium and would consider endometrial biopsy. Myna Hidalgo, DO Attending Obstetrician & Gynecologist, [facility] for [facility], [facility]    US Transvaginal Non-OB  Result Date: 04/06/2023 Images from the original result were not included.  ..an [employer] Medicine Technical sales engineer) accredited practice Center for [facility] @ [facility] [address] [place] [address] Ordering Provider: Adline Potter, NP  GYNECOLOGIC SONOGRAM TYREANNA GHAZAL is a 62 y.o. G2P0011 Patient's last menstrual period was 05/05/2015. She is here for a pelvic sonogram for postmenopausal bleeding. Uterus                      6.1 x 5.3 x 5.8 cm, Total uterine volume 98 cc,heterogeneous axial positioned uterus,mid/fundal hypoechoic mass with calcifications,? submucosal fibroid 5.4 x 4.6 x  5.2 cm Endometrium          unable to visualized endometrium because of fibroid Right ovary             1.7 x 1.1 x 1.8 cm, normal Left ovary                1.7 x 1 x 1.8 cm, normal No free fluid Technician Comments: PELVIC US TV/TA:heterogeneous axial positioned uterus,mid/fundal hypoechoic mass with calcifications,? submucosal fibroid 5.4 x 4.6 x 5.2 cm,normal ovaries,ovaries appear mobile,no pain during ultrasound,unable to visualized endometrium because of fibroid,no free fluid Chaperone [address] Flora Lipps 04/05/2023 2:50 PM Clinical Impression and recommendations: I have reviewed the sonogram results above, combined with the patient's current clinical course, below are my impressions and any appropriate recommendations for management based on the sonographic findings. Normal uterine size and shape.  Within uterus hypoechoic mass, suggestive of submucosal fibroid measuring 5.4 x 4.6 x 5.2 cm. Unable to visualize endometrium due to uterine fibroid. Normal ovaries bilaterally. In the setting of postmenopausal bleeding, would advise further evaluation of the endometrium and would consider endometrial biopsy. Myna Hidalgo, DO Attending Obstetrician & Gynecologist, [facility] for [facility], [facility]     Assessment   Pancreatic cyst: Due for surveillance MRI.  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 Patient's daughter was diagnosed with a rare form of pancreatic cancer at age 41 a couple of years ago, required resection only.   Barrett's esophagus: Diagnosed initially in 2015.  Denies typical reflux symptoms.  She has been off PPI for some time.  She is due for surveillance EGD.  Discussed with her that typically we do not recommend PPI therapy in patients with Barrett's but we can wait till her endoscopy  to provide additional recommendations.     PLAN   EGD with Dr. Jena Gauss. ASA 2.  I have discussed the risks, alternatives, benefits with regards to but not limited to the risk of reaction to medication, bleeding, infection, perforation and the patient is agreeable to proceed. Written consent to be obtained. MRI Abd with and without contrast.   Leanna Battles. Melvyn Neth, MHS, PA-C [facility]

## 2023-04-28 ENCOUNTER — Telehealth: Payer: Self-pay

## 2023-04-28 NOTE — Telephone Encounter (Signed)
Patient would like for Victorino Dike to call her with some concerns about her pessary.

## 2023-04-28 NOTE — Telephone Encounter (Signed)
Pessary is coming out 2-3 times a day, now. Call office in am and get appointment to see Dr Charlotta Newton about a different pessary or possible surgery

## 2023-05-03 ENCOUNTER — Ambulatory Visit: Payer: BC Managed Care – PPO | Admitting: Urology

## 2023-05-03 ENCOUNTER — Encounter: Payer: Self-pay | Admitting: Urology

## 2023-05-03 VITALS — BP 113/77 | HR 79

## 2023-05-03 DIAGNOSIS — R3915 Urgency of urination: Secondary | ICD-10-CM | POA: Diagnosis not present

## 2023-05-03 DIAGNOSIS — N3946 Mixed incontinence: Secondary | ICD-10-CM | POA: Diagnosis not present

## 2023-05-03 DIAGNOSIS — R3129 Other microscopic hematuria: Secondary | ICD-10-CM

## 2023-05-03 DIAGNOSIS — N3281 Overactive bladder: Secondary | ICD-10-CM | POA: Diagnosis not present

## 2023-05-03 DIAGNOSIS — R109 Unspecified abdominal pain: Secondary | ICD-10-CM

## 2023-05-03 DIAGNOSIS — M545 Low back pain, unspecified: Secondary | ICD-10-CM

## 2023-05-03 DIAGNOSIS — R8290 Unspecified abnormal findings in urine: Secondary | ICD-10-CM

## 2023-05-03 DIAGNOSIS — R350 Frequency of micturition: Secondary | ICD-10-CM

## 2023-05-03 DIAGNOSIS — N3000 Acute cystitis without hematuria: Secondary | ICD-10-CM

## 2023-05-03 DIAGNOSIS — R1030 Lower abdominal pain, unspecified: Secondary | ICD-10-CM

## 2023-05-03 LAB — URINALYSIS, ROUTINE W REFLEX MICROSCOPIC
Bilirubin, UA: NEGATIVE
Glucose, UA: NEGATIVE
Ketones, UA: NEGATIVE
Nitrite, UA: NEGATIVE
Protein,UA: NEGATIVE
Specific Gravity, UA: 1.01 (ref 1.005–1.030)
Urobilinogen, Ur: 0.2 mg/dL (ref 0.2–1.0)
pH, UA: 5.5 (ref 5.0–7.5)

## 2023-05-03 LAB — MICROSCOPIC EXAMINATION: WBC, UA: >30 /hpf — AB (ref 0–5)

## 2023-05-03 LAB — BLADDER SCAN AMB NON-IMAGING

## 2023-05-03 NOTE — Addendum Note (Signed)
Addended by: Tilden Dome on: 05/03/2023 04:33 PM   Modules accepted: Orders

## 2023-05-03 NOTE — Progress Notes (Signed)
History of Present Illness: Vanessa Stokes is a 62 y.o. female who presents today for follow up visit at [facility]. - GU / GYN history: 1. OAB with urinary frequency, urgency, and urge incontinence. - Reports caffeine intake (coffee). 2. Stress urinary incontinence.  3. Anterior vaginal prolapse (cystocele).  - Followed by GYN (Dr. Charlotta Newton); managed with pessary.  - Not currently sexually active.   At last visit with Dr. Retta Diones on 11/30/2022: - She was given 6 weeks of Myrbetriq 25 mg. - The plan was to follow up in 6 weeks to recheck symptoms.  Today: She reports intermittent right low back pain which started about 3 months ago. She reports mild generalized lower abdominal discomfort; denies bladder pain. She denies fevers, dysuria, gross hematuria, straining to void, or sensations of incomplete emptying. Denies history of kidney stones.   She reports ongoing urinary urgency and urge incontinence. She denies recent problems with stress incontinence. She leaks "a couple times per week".  She reports urinary incontinence is not significantly bothersome.   She states she only took the Myrbetriq for a couple days.  She reports drinking  "a lot of coffee" and verbalizes awareness of how that likely exacerbates her urinary symptoms.   Urine culture results in past 12 months: - 07/22/2022: Negative - 04/07/2023: Positive for 50-100k E. Coli (pan-sensitive)   Fall Screening: Do you usually have a device to assist in your mobility? No    Medications: Current Outpatient Medications  Medication Sig Dispense Refill   ALPRAZolam (XANAX) 0.5 MG tablet Take 0.5 mg by mouth daily as needed for anxiety.     diphenhydramine-acetaminophen (TYLENOL PM) 25-500 MG TABS tablet Take 1 tablet by mouth at bedtime as needed (sleep).     PARoxetine (PAXIL) 20 MG tablet Take 20 mg by mouth every morning.     No current facility-administered medications for this visit.     Allergies: No Known Allergies  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HCC)    Anemia    Bloated abdomen 05/12/2015   Depression    Paxil   Dysmenorrhea 05/12/2015   Fatigue 05/12/2015   Fibroid 05/15/2015   Fibroids 05/19/2015   Menometrorrhagia 05/12/2015   Pancreatic lesion 11/30/2017   Density in pancreatic head needs F/U in 1 year    Postmenopausal 05/15/2015   Postmenopausal bleeding 05/19/2015   Vaginal wall cyst 05/12/2015   Past Surgical History:  Procedure Laterality Date   BIOPSY  12/07/2019   Procedure: BIOPSY;  Surgeon: Corbin Ade, MD;  Location: [facility];  Service: Endoscopy;;   BRAIN SURGERY     COLONOSCOPY  12/04/2008   Dr. Rourk:Friable anal canal and anal papilla, otherwise normal rectum/Left-sided diverticula polyp at the splenic flexure, status post cold snared.  The remainder of the colonic mucosa and terminal  ileum mucosa appeared normal. BENIGN polyp.    COLONOSCOPY, ESOPHAGOGASTRODUODENOSCOPY (EGD) AND ESOPHAGEAL DILATION N/A 01/28/2014   Dr. Jena Gauss: Erosive reflux esophagitis, biopsy-proven short segment Barrett's esophagus with no dysplasia, status post Maloney dilation, hernia, gastric erosions with benign biopsy/no H. pylori, benign small bowel biopsies.  Normal ileocolonoscopy.  Plans to repeat EGD in 1 year and colonoscopy in 10 years.   ESOPHAGOGASTRODUODENOSCOPY N/A 12/07/2019   Rourk: Barrett's esophagus with no dysplasia, noncritical Schatzki ring not manipulated.  Next EGD in January 2024.   pituitary gland tumors removed     X 2   Family History  Problem Relation Age of Onset  Cancer Mother        lung   Cancer Father        lung   Other Daughter        hip problems; degenerative hip dysplasia   Pancreatic cancer Daughter        pancreatic tail, age 66.   Colon cancer Neg Hx    Social History   Socioeconomic History   Marital status: Divorced    Spouse name: Not on file   Number of children: Not on file   Years of  education: Not on file   Highest education level: Not on file  Occupational History   Occupation: [employer] doctor: [employer]  Tobacco Use   Smoking status: Every Day    Packs/day: .5    Types: Cigarettes   Smokeless tobacco: Never   Tobacco comments:    Smokes about 3 cigarettes daily  Vaping Use   Vaping Use: Never used  Substance and Sexual Activity   Alcohol use: Not Currently    Comment: drank heavily from age [age]. rare etoh since then   Drug use: No   Sexual activity: Not Currently    Birth control/protection: Post-menopausal  Other Topics Concern   Not on file  Social History Narrative   Not on file   Social Determinants of Health   Financial Resource Strain: Not on file  Food Insecurity: No Food Insecurity (07/22/2022)   Hunger Vital Sign    Worried About Running Out of Food in the Last Year: Never true    Ran Out of Food in the Last Year: Never true  Transportation Needs: No Transportation Needs (07/22/2022)   PRAPARE - Administrator, Civil Service (Medical): No    Lack of Transportation (Non-Medical): No  Physical Activity: Not on file  Stress: Not on file  Social Connections: Not on file  Intimate Partner Violence: Not At Risk (07/22/2022)   Humiliation, Afraid, Rape, and Kick questionnaire    Fear of Current or Ex-Partner: No    Emotionally Abused: No    Physically Abused: No    Sexually Abused: No    Review of Systems Constitutional: Patient denies any unintentional weight loss or change in strength lntegumentary: Patient denies any rashes or pruritus Eyes: Patient denies dry eyes ENT: Patient denies dry mouth Cardiovascular: Patient denies chest pain or syncope Respiratory: Patient denies shortness of breath Gastrointestinal: Patient reports abdominal pain  Musculoskeletal: Patient denies muscle cramps or weakness Neurologic: Patient denies convulsions or seizures Psychiatric: Patient denies memory  problems Allergic/Immunologic: Patient denies recent allergic reaction(s) Hematologic/Lymphatic: Patient denies bleeding tendencies Endocrine: Patient denies heat/cold intolerance  GU: As per HPI.  OBJECTIVE Vitals:   05/03/23 1502  BP: 113/77  Pulse: 79   There is no height or weight on file to calculate BMI.  Physical Examination  Constitutional: No obvious distress; patient is non-toxic appearing  Cardiovascular: No visible lower extremity edema.  Respiratory: The patient does not have audible wheezing/stridor; respirations do not appear labored  Gastrointestinal: Abdomen non-distended Musculoskeletal: Normal ROM of UEs  Skin: No obvious rashes/open sores  Neurologic: CN 2-12 grossly intact Psychiatric: Answered questions appropriately with normal affect  Hematologic/Lymphatic/Immunologic: No obvious bruises or sites of spontaneous bleeding  UA: positive for >30 WBC/hpf, 3-10 RBC/hpf, bacteria (few) PVR: 116 ml  ASSESSMENT Urinary frequency - Plan: Urinalysis, Routine w reflex microscopic, Bladder Scan (Post Void Residual) in office, Urine Culture  OAB (overactive bladder) - Plan: Urinalysis, Routine w reflex  microscopic, Bladder Scan (Post Void Residual) in office, Urine Culture  Urinary urgency - Plan: Urinalysis, Routine w reflex microscopic, Bladder Scan (Post Void Residual) in office, Urine Culture  Mixed stress and urge urinary incontinence - Plan: Urine Culture  Right flank pain - Plan: DG Abd 1 View, US RENAL, Urine Culture  Right-sided low back pain without sciatica, unspecified chronicity - Plan: DG Abd 1 View, US RENAL, Urine Culture  Lower abdominal pain - Plan: DG Abd 1 View, US RENAL, Urine Culture  Microscopic hematuria - Plan: DG Abd 1 View, US RENAL, Urine Culture  Abnormal urinalysis - Plan: Urine Culture  We discussed possible etiologies for her flank / back / abdominal pain including but not limited to: UTI, stone, uterine fibroids, dysmenorrhea,  musculoskeletal. Will send for urine culture to evaluate for UTI based on abnormal urinalysis result. Agreed to proceed with KUB & RUS to assess for kidney stones. She was advised that additional evaluation may be necessary if unable to determine etiology of her microscopic hematuria based on those results.   Advised decreased caffeine intake for OAB symptoms. She declined interest in medication or other OAB interventions at this time.  Will plan for follow up in 6 months or sooner if needed. Pt verbalized understanding and agreement. All questions were answered.   PLAN Advised the following: 1. Urine culture. 2. RUS & KUB this week. 3. Return in about 6 months (around 11/02/2023) for UA, PVR, & f/u with Evette Georges NP.  Orders Placed This Encounter  Procedures   Microscopic Examination   Urine Culture    Standing Status:   Future    Standing Expiration Date:   05/02/2024   DG Abd 1 View    Standing Status:   Future    Standing Expiration Date:   05/02/2024    Order Specific Question:   Reason for Exam (SYMPTOM  OR DIAGNOSIS REQUIRED)    Answer:   right flank / back pain, possible kidney stone    Order Specific Question:   Is patient pregnant?    Answer:   No    Order Specific Question:   Preferred imaging location?    Answer:   [facility]   US RENAL    Standing Status:   Future    Standing Expiration Date:   05/02/2024    Order Specific Question:   Reason for Exam (SYMPTOM  OR DIAGNOSIS REQUIRED)    Answer:   kidney stone known or suspected    Order Specific Question:   Preferred imaging location?    Answer:   [facility]   Urinalysis, Routine w reflex microscopic   Bladder Scan (Post Void Residual) in office    It has been explained that the patient is to follow regularly with their PCP in addition to all other providers involved in their care and to follow instructions provided by these respective offices. Patient advised to contact urology clinic if any  urologic-pertaining questions, concerns, new symptoms or problems arise in the interim period.  There are no Patient Instructions on file for this visit.  Electronically signed by:  Donnita Falls, FNP   05/03/23    3:52 PM

## 2023-05-04 ENCOUNTER — Telehealth: Payer: Self-pay

## 2023-05-04 NOTE — Telephone Encounter (Signed)
Please call patient back to discuss culture.  Please advise.

## 2023-05-05 ENCOUNTER — Telehealth: Payer: Self-pay

## 2023-05-05 NOTE — Telephone Encounter (Signed)
Patient called in about concern with her urinalysis being abnormal . Patient was made aware that urine culture is pending. Patient voiced understanding

## 2023-05-05 NOTE — Telephone Encounter (Signed)
Tried calling patient with no answer, left voiced message for return call. 

## 2023-05-05 NOTE — Telephone Encounter (Signed)
Patient called advising she received her urine results via MyChart and did not quite understand the results. She requested a call yo discuss the results if possible.     Thank you

## 2023-05-07 LAB — URINE CULTURE

## 2023-05-09 ENCOUNTER — Telehealth: Payer: Self-pay

## 2023-05-09 MED ORDER — NITROFURANTOIN MONOHYD MACRO 100 MG PO CAPS
100.0000 mg | ORAL_CAPSULE | Freq: Two times a day (BID) | ORAL | 0 refills | Status: AC
Start: 2023-05-09 — End: 2023-05-16

## 2023-05-09 NOTE — Addendum Note (Signed)
Addended byEvette Georges on: 05/09/2023 02:13 PM   Modules accepted: Orders

## 2023-05-09 NOTE — Telephone Encounter (Signed)
I called patient to inform her of her culture results as well as the rx sent to pharmacy.  Patient states she had just finished Macrobid on 05/26 and still had symptoms of a UTI.  Do you recommend an alternative antibiotic?  Please advise

## 2023-05-09 NOTE — Telephone Encounter (Signed)
-----   Message from Donnita Falls, FNP sent at 05/09/2023  2:13 PM EDT ----- Please let pt know urine culture result. I have sent prescription for Macrobid. Thanks.

## 2023-05-10 ENCOUNTER — Other Ambulatory Visit: Payer: Self-pay | Admitting: Urology

## 2023-05-10 DIAGNOSIS — N390 Urinary tract infection, site not specified: Secondary | ICD-10-CM

## 2023-05-10 MED ORDER — SULFAMETHOXAZOLE-TRIMETHOPRIM 800-160 MG PO TABS
1.0000 | ORAL_TABLET | Freq: Two times a day (BID) | ORAL | 0 refills | Status: AC
Start: 2023-05-10 — End: 2023-05-17

## 2023-05-10 NOTE — Telephone Encounter (Signed)
Patient aware via vm 

## 2023-05-12 ENCOUNTER — Telehealth: Payer: Self-pay

## 2023-05-12 ENCOUNTER — Ambulatory Visit (HOSPITAL_COMMUNITY)
Admission: RE | Admit: 2023-05-12 | Discharge: 2023-05-12 | Disposition: A | Discharge status: Home / Self Care | Payer: BC Managed Care – PPO | Source: Ambulatory Visit | Attending: Urology | Admitting: Urology

## 2023-05-12 DIAGNOSIS — R109 Unspecified abdominal pain: Secondary | ICD-10-CM | POA: Insufficient documentation

## 2023-05-12 DIAGNOSIS — M545 Low back pain, unspecified: Secondary | ICD-10-CM

## 2023-05-12 DIAGNOSIS — R3129 Other microscopic hematuria: Secondary | ICD-10-CM | POA: Insufficient documentation

## 2023-05-12 DIAGNOSIS — R1030 Lower abdominal pain, unspecified: Secondary | ICD-10-CM | POA: Insufficient documentation

## 2023-05-12 DIAGNOSIS — Z0389 Encounter for observation for other suspected diseases and conditions ruled out: Secondary | ICD-10-CM | POA: Diagnosis not present

## 2023-05-12 NOTE — Telephone Encounter (Signed)
-----   Message from Donnita Falls, FNP sent at 05/12/2023  5:10 PM EDT ----- Please let pt know RUS was normal - no stones, masses, or hydronephrosis. Thanks.

## 2023-05-12 NOTE — Telephone Encounter (Signed)
Patient made aware of Sarah recommendation and voiced understanding.

## 2023-05-16 ENCOUNTER — Ambulatory Visit (HOSPITAL_COMMUNITY): Admission: RE | Admit: 2023-05-16 | Payer: BC Managed Care – PPO | Source: Ambulatory Visit

## 2023-05-18 ENCOUNTER — Telehealth: Payer: Self-pay

## 2023-05-18 ENCOUNTER — Other Ambulatory Visit: Payer: Self-pay | Admitting: Urology

## 2023-05-18 DIAGNOSIS — R1030 Lower abdominal pain, unspecified: Secondary | ICD-10-CM

## 2023-05-18 DIAGNOSIS — M545 Low back pain, unspecified: Secondary | ICD-10-CM

## 2023-05-18 DIAGNOSIS — R109 Unspecified abdominal pain: Secondary | ICD-10-CM

## 2023-05-18 NOTE — Telephone Encounter (Signed)
-----   Message from Donnita Falls, FNP sent at 05/18/2023  8:40 AM EDT ----- Please let patient know that there were non-specific calcifications in the pelvis on her xray which are likely phleboliths but could be ureteral stones. Given her report of intermittent right low back pain x3 month, I recommend CT stone for further evaluation. Order has been placed.

## 2023-05-18 NOTE — Progress Notes (Signed)
Please let patient know that there were non-specific calcifications in the pelvis on her xray which are likely phleboliths but could be ureteral stones. Given her report of intermittent right low back pain x3 month, I recommend CT stone for further evaluation. Order has been placed.

## 2023-05-18 NOTE — Telephone Encounter (Signed)
Patient is made aware of Sarah recommendation and voiced understanding. 

## 2023-05-19 ENCOUNTER — Ambulatory Visit (HOSPITAL_COMMUNITY): Payer: BC Managed Care – PPO

## 2023-05-19 ENCOUNTER — Encounter (HOSPITAL_COMMUNITY): Payer: Self-pay

## 2023-05-30 ENCOUNTER — Telehealth: Payer: Self-pay

## 2023-05-30 NOTE — Telephone Encounter (Signed)
Patient calling to check on CT scan approval.  Please advise.  Call:  [phone]

## 2023-05-30 NOTE — Telephone Encounter (Signed)
My chart message sent approval obtained

## 2023-06-01 ENCOUNTER — Telehealth: Payer: Self-pay | Admitting: *Deleted

## 2023-06-01 NOTE — Telephone Encounter (Signed)
Pt left vm wanting to know arrival time for her procedure tomorrow.  LMTRC

## 2023-06-02 ENCOUNTER — Ambulatory Visit (HOSPITAL_COMMUNITY)
Admission: RE | Admit: 2023-06-02 | Discharge: 2023-06-02 | Disposition: A | Discharge status: Home / Self Care | Payer: BC Managed Care – PPO | Attending: Internal Medicine | Admitting: Internal Medicine

## 2023-06-02 ENCOUNTER — Telehealth: Payer: Self-pay

## 2023-06-02 ENCOUNTER — Ambulatory Visit (HOSPITAL_COMMUNITY): Payer: BC Managed Care – PPO | Admitting: Anesthesiology

## 2023-06-02 ENCOUNTER — Other Ambulatory Visit: Payer: Self-pay

## 2023-06-02 ENCOUNTER — Encounter (HOSPITAL_COMMUNITY): Payer: Self-pay | Admitting: Internal Medicine

## 2023-06-02 ENCOUNTER — Encounter (HOSPITAL_COMMUNITY)
Admission: RE | Disposition: A | Discharge status: Home / Self Care | Payer: Self-pay | Source: Home / Self Care | Attending: Internal Medicine

## 2023-06-02 DIAGNOSIS — K2100 Gastro-esophageal reflux disease with esophagitis, without bleeding: Secondary | ICD-10-CM

## 2023-06-02 DIAGNOSIS — R011 Cardiac murmur, unspecified: Secondary | ICD-10-CM | POA: Diagnosis not present

## 2023-06-02 DIAGNOSIS — K2270 Barrett's esophagus without dysplasia: Secondary | ICD-10-CM

## 2023-06-02 DIAGNOSIS — K449 Diaphragmatic hernia without obstruction or gangrene: Secondary | ICD-10-CM | POA: Diagnosis not present

## 2023-06-02 DIAGNOSIS — K2950 Unspecified chronic gastritis without bleeding: Secondary | ICD-10-CM | POA: Diagnosis not present

## 2023-06-02 DIAGNOSIS — F17210 Nicotine dependence, cigarettes, uncomplicated: Secondary | ICD-10-CM | POA: Diagnosis not present

## 2023-06-02 DIAGNOSIS — F32A Depression, unspecified: Secondary | ICD-10-CM | POA: Insufficient documentation

## 2023-06-02 HISTORY — PX: ESOPHAGOGASTRODUODENOSCOPY (EGD) WITH PROPOFOL: SHX5813

## 2023-06-02 HISTORY — PX: BIOPSY: SHX5522

## 2023-06-02 SURGERY — ESOPHAGOGASTRODUODENOSCOPY (EGD) WITH PROPOFOL
Anesthesia: General

## 2023-06-02 MED ORDER — LACTATED RINGERS IV SOLN: INTRAVENOUS | Status: DC

## 2023-06-02 MED ORDER — PROPOFOL 500 MG/50ML IV EMUL
INTRAVENOUS | Status: DC | PRN
  Administered 2023-06-02: 150 ug/kg/min via INTRAVENOUS

## 2023-06-02 MED ORDER — PANTOPRAZOLE SODIUM 40 MG PO TBEC: 40.0000 mg | DELAYED_RELEASE_TABLET | Freq: Every day | ORAL | 11 refills | Status: AC

## 2023-06-02 MED ORDER — PHENYLEPHRINE HCL (PRESSORS) 10 MG/ML IV SOLN
INTRAVENOUS | Status: DC | PRN
  Administered 2023-06-02: 80 ug via INTRAVENOUS

## 2023-06-02 MED ORDER — LIDOCAINE HCL (CARDIAC) PF 100 MG/5ML IV SOSY
PREFILLED_SYRINGE | INTRAVENOUS | Status: DC | PRN
  Administered 2023-06-02: 80 mg via INTRAVENOUS

## 2023-06-02 NOTE — Op Note (Addendum)
[facility] Patient Name: Vanessa Stokes Procedure Date: 06/02/2023 1:49 PM MRN: [id number] Date of Birth: May 26, 1961 Attending MD: Gennette Pac , MD, [id number] CSN: [id number] Age: 62 Admit Type: Outpatient Procedure:                Upper GI endoscopy Indications:              Barrett's esophagus Providers:                Gennette Pac, MD, Nena Polio, RN, Zena Amos Referring MD:              Medicines:                Propofol per Anesthesia Complications:            No immediate complications. Estimated Blood Loss:     Estimated blood loss was minimal. Procedure:                Pre-Anesthesia Assessment:                           - Prior to the procedure, a History and Physical                            was performed, and patient medications and                            allergies were reviewed. The patient's tolerance of                            previous anesthesia was also reviewed. The risks                            and benefits of the procedure and the sedation                            options and risks were discussed with the patient.                            All questions were answered, and informed consent                            was obtained. Prior Anticoagulants: The patient has                            taken no anticoagulant or antiplatelet agents. ASA                            Grade Assessment: II - A patient with mild systemic                            disease. After reviewing the risks and benefits,  the patient was deemed in satisfactory condition to                            undergo the procedure.                           After obtaining informed consent, the endoscope was                            passed under direct vision. Throughout the                            procedure, the patient's blood pressure, pulse, and                            oxygen saturations were  monitored continuously. The                            [id number]-[id number] ([id number]) scope was introduced through the                            mouth, and advanced to the second part of duodenum.                            The upper GI endoscopy was accomplished without                            difficulty. The patient tolerated the procedure                            well. Scope In: 2:07:24 PM Scope Out: 2:13:22 PM Total Procedure Duration: 0 hours 5 minutes 58 seconds  Findings:      2 tongues of salmon-colored epithelium coming less than 1.5 cm from the       GE junction. No nodularity. Did have associated distal esophageal       erosions. Tubular esophagus patent throughout its course.      A small hiatal hernia was present. Gastric cavity empty.       Normal-appearing gastric mucosa. Patent pylorus.      The duodenal bulb and second portion of the duodenum were normal.       Biopsies of both tongues taken for histologic study. Impression:               - Small hiatal hernia. Short segment Barrett's                            esophagus as described status post biopsy.                           - Normal duodenal bulb and second portion of the                            duodenum. Moderate Sedation:      Moderate (conscious) sedation was personally administered by an       anesthesia professional. The following parameters  were monitored: oxygen       saturation, heart rate, blood pressure, respiratory rate, EKG, adequacy       of pulmonary ventilation, and response to care. Recommendation:           - Patient has a contact number available for                            emergencies. The signs and symptoms of potential                            delayed complications were discussed with the                            patient. Return to normal activities tomorrow.                            Written discharge instructions were provided to the                            patient.                            - Advance diet as tolerated. Begin Protonix 40 mg 1                            pill daily 30 minutes before breakfast. Office                            visit with [place] in 3 months. Further recommendations                            to follow pending review of pathology report Procedure Code(s):        --- Professional ---                           [phone], Esophagogastroduodenoscopy, flexible,                            transoral; diagnostic, including collection of                            specimen(s) by brushing or washing, when performed                            (separate procedure) Diagnosis Code(s):        --- Professional ---                           K44.9, Diaphragmatic hernia without obstruction or                            gangrene                           K22.70, Barrett's esophagus without dysplasia CPT copyright 2022 American Medical Association. All rights reserved.  The codes documented in this report are preliminary and upon coder review may  be revised to meet current compliance requirements. Gerrit Friends. Larico Dimock, MD Gennette Pac, MD 06/02/2023 2:20:28 PM This report has been signed electronically. Number of Addenda: 0

## 2023-06-02 NOTE — Discharge Instructions (Addendum)
EGD Discharge instructions Please read the instructions outlined below and refer to this sheet in the next few weeks. These discharge instructions provide you with general information on caring for yourself after you leave the hospital. Your doctor may also give you specific instructions. While your treatment has been planned according to the most current medical practices available, unavoidable complications occasionally occur. If you have any problems or questions after discharge, please call your doctor. ACTIVITY You may resume your regular activity but move at a slower pace for the next 24 hours.  Take frequent rest periods for the next 24 hours.  Walking will help expel (get rid of) the air and reduce the bloated feeling in your abdomen.  No driving for 24 hours (because of the anesthesia (medicine) used during the test).  You may shower.  Do not sign any important legal documents or operate any machinery for 24 hours (because of the anesthesia used during the test).  NUTRITION Drink plenty of fluids.  You may resume your normal diet.  Begin with a light meal and progress to your normal diet.  Avoid alcoholic beverages for 24 hours or as instructed by your caregiver.  MEDICATIONS You may resume your normal medications unless your caregiver tells you otherwise.  WHAT YOU CAN EXPECT TODAY You may experience abdominal discomfort such as a feeling of fullness or "gas" pains.  FOLLOW-UP Your doctor will discuss the results of your test with you.  SEEK IMMEDIATE MEDICAL ATTENTION IF ANY OF THE FOLLOWING OCCUR: Excessive nausea (feeling sick to your stomach) and/or vomiting.  Severe abdominal pain and distention (swelling).  Trouble swallowing.  Temperature over 101 F (37.8 C).  Rectal bleeding or vomiting of blood.     Barrett's esophagus appeared unchanged.  Biopsies taken.  You do have acid reflux irritation in your esophagus.    Begin Protonix 40 mg pill take 30 minutes before  breakfast every day.  A new prescription has been provided to your pharmacy from my office.    GERD and Barrett's esophagus information provided   office visit with Tana Coast in 3 months   at patient request, I called Bonney Roussel and reviewed findings at [phone]

## 2023-06-02 NOTE — Telephone Encounter (Signed)
-----   Message from Eula Listen sent at 06/02/2023  2:14 PM EDT -----   New prescription Protonix 40 mg dispense -  dispense 30 with 11 refills.  Take 1 in the morning 30 minutes before

## 2023-06-02 NOTE — Telephone Encounter (Signed)
Rx sent to pharmacy on file.

## 2023-06-02 NOTE — Anesthesia Procedure Notes (Signed)
Date/Time: 06/02/2023 1:59 PM  Performed by: Franco Nones, CRNAPre-anesthesia Checklist: Patient identified, Emergency Drugs available, Suction available, Timeout performed and Patient being monitored Patient Re-evaluated:Patient Re-evaluated prior to induction Oxygen Delivery Method: Nasal Cannula

## 2023-06-02 NOTE — Anesthesia Postprocedure Evaluation (Signed)
Anesthesia Post Note  Patient: Vanessa Stokes  Procedure(s) Performed: ESOPHAGOGASTRODUODENOSCOPY (EGD) WITH PROPOFOL BIOPSY  Patient location during evaluation: Phase II Anesthesia Type: General Level of consciousness: awake and alert and oriented Pain management: pain level controlled Vital Signs Assessment: post-procedure vital signs reviewed and stable Respiratory status: spontaneous breathing, nonlabored ventilation and respiratory function stable Cardiovascular status: blood pressure returned to baseline and stable Postop Assessment: no apparent nausea or vomiting Anesthetic complications: no  No notable events documented.   Last Vitals:  Vitals:   06/02/23 1311 06/02/23 1421  BP: 107/68 (!) 83/46  Pulse: 83 70  Resp: 11 15  Temp: 36.9 C 36.6 C  SpO2: 97% 99%    Last Pain:  Vitals:   06/02/23 1421  TempSrc: Axillary  PainSc: 0-No pain                 Barbee Mamula C Berlinda Farve

## 2023-06-02 NOTE — Anesthesia Preprocedure Evaluation (Addendum)
Anesthesia Evaluation  Patient identified by MRN, date of birth, ID band Patient awake    Reviewed: Allergy & Precautions, H&P , NPO status , Patient's Chart, lab work & pertinent test results  History of Anesthesia Complications Negative for: history of anesthetic complications  Airway Mallampati: II  TM Distance: >3 FB Neck ROM: Full    Dental  (+) Dental Advisory Given   Pulmonary Current Smoker and Patient abstained from smoking.   Pulmonary exam normal breath sounds clear to auscultation       Cardiovascular negative cardio ROS  Rhythm:Regular Rate:Normal + Systolic murmurs    Neuro/Psych  PSYCHIATRIC DISORDERS  Depression    Surgery for acromegaly, pituitary tumor resection  negative neurological ROS     GI/Hepatic Neg liver ROS,GERD  ,,  Endo/Other  negative endocrine ROS    Renal/GU negative Renal ROS  negative genitourinary   Musculoskeletal negative musculoskeletal ROS (+)    Abdominal   Peds negative pediatric ROS (+)  Hematology  (+) Blood dyscrasia, anemia   Anesthesia Other Findings Acromegaly   Reproductive/Obstetrics negative OB ROS                              Anesthesia Physical Anesthesia Plan  ASA: 2  Anesthesia Plan: General   Post-op Pain Management: Minimal or no pain anticipated   Induction: Intravenous  PONV Risk Score and Plan: 1 and Propofol infusion  Airway Management Planned: Nasal Cannula and Natural Airway  Additional Equipment:   Intra-op Plan:   Post-operative Plan:   Informed Consent: I have reviewed the patients History and Physical, chart, labs and discussed the procedure including the risks, benefits and alternatives for the proposed anesthesia with the patient or authorized representative who has indicated his/her understanding and acceptance.     Dental advisory given  Plan Discussed with: CRNA and Surgeon  Anesthesia Plan  Comments:         Anesthesia Quick Evaluation

## 2023-06-02 NOTE — Transfer of Care (Signed)
Immediate Anesthesia Transfer of Care Note  Patient: Vanessa Stokes  Procedure(s) Performed: ESOPHAGOGASTRODUODENOSCOPY (EGD) WITH PROPOFOL BIOPSY  Patient Location: Endoscopy Unit  Anesthesia Type:General  Level of Consciousness: awake and patient cooperative  Airway & Oxygen Therapy: Patient Spontanous Breathing  Post-op Assessment: Report given to RN and Post -op Vital signs reviewed and stable  Post vital signs: Reviewed and stable  Last Vitals:  Vitals Value Taken Time  BP 83/46 06/02/23 1421  Temp 36.6 C 06/02/23 1421  Pulse 70 06/02/23 1421  Resp 15 06/02/23 1421  SpO2 99 % 06/02/23 1421    Last Pain:  Vitals:   06/02/23 1421  TempSrc: Axillary  PainSc: 0-No pain      Patients Stated Pain Goal: 7 (06/02/23 1311)  Complications: No notable events documented.

## 2023-06-02 NOTE — H&P (Signed)
@LOGO @   Primary Care Physician:  Vanessa Found, MD Primary Gastroenterologist:  Dr. Jena Stokes  Pre-Procedure History & Physical: HPI:  Vanessa Stokes is a 62 y.o. female here for  surveillance EGD.  History of short segment Barrett's esophagus without dysplasia.  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HCC)    Anemia    Bloated abdomen 05/12/2015   Depression    Paxil   Dysmenorrhea 05/12/2015   Fatigue 05/12/2015   Fibroid 05/15/2015   Fibroids 05/19/2015   Menometrorrhagia 05/12/2015   Pancreatic lesion 11/30/2017   Density in pancreatic head needs F/U in 1 year    Postmenopausal 05/15/2015   Postmenopausal bleeding 05/19/2015   Vaginal wall cyst 05/12/2015    Past Surgical History:  Procedure Laterality Date   BIOPSY  12/07/2019   Procedure: BIOPSY;  Surgeon: Vanessa Ade, MD;  Location: [facility];  Service: Endoscopy;;   BRAIN SURGERY     COLONOSCOPY  12/04/2008   Dr. Shamina Stokes:Friable anal canal and anal papilla, otherwise normal rectum/Left-sided diverticula polyp at the splenic flexure, status post cold snared.  The remainder of the colonic mucosa and terminal  ileum mucosa appeared normal. BENIGN polyp.    COLONOSCOPY, ESOPHAGOGASTRODUODENOSCOPY (EGD) AND ESOPHAGEAL DILATION N/A 01/28/2014   Dr. Jena Stokes: Erosive reflux esophagitis, biopsy-proven short segment Barrett's esophagus with no dysplasia, status post Maloney dilation, hernia, gastric erosions with benign biopsy/no H. pylori, benign small bowel biopsies.  Normal ileocolonoscopy.  Plans to repeat EGD in 1 year and colonoscopy in 10 years.   ESOPHAGOGASTRODUODENOSCOPY N/A 12/07/2019   Vanessa Stokes: Barrett's esophagus with no dysplasia, noncritical Schatzki ring not manipulated.  Next EGD in January 2024.   pituitary gland tumors removed     X 2    Prior to Admission medications   Medication Sig Start Date End Date Taking? Authorizing Provider  ALPRAZolam Prudy Feeler) 0.5 MG tablet Take 0.5 mg by mouth daily as needed for  anxiety.   Yes [provider]  diphenhydramine-acetaminophen (TYLENOL PM) 25-500 MG TABS tablet Take 1 tablet by mouth at bedtime as needed (sleep).   Yes [provider]  PARoxetine (PAXIL) 20 MG tablet Take 20 mg by mouth every morning.   Yes [provider]    Allergies as of 04/27/2023   (No Known Allergies)    Family History  Problem Relation Age of Onset   Cancer Mother        lung   Cancer Father        lung   Other Vanessa Stokes        hip problems; degenerative hip dysplasia   Pancreatic cancer Vanessa Stokes        pancreatic tail, age 64.   Colon cancer Neg Hx     Social History   Socioeconomic History   Marital status: Divorced    Spouse name: Not on file   Number of children: Not on file   Years of education: Not on file   Highest education level: Not on file  Occupational History   Occupation: Vanessa Stokes: Vanessa  Tobacco Use   Smoking status: Every Day    Current packs/day: 0.50    Types: Cigarettes   Smokeless tobacco: Never   Tobacco comments:    Smokes about 3 cigarettes daily  Vaping Use   Vaping status: Never Used  Substance and Sexual Activity   Alcohol use: Not Currently    Comment: drank heavily from age [age]. rare etoh since then  Drug use: No   Sexual activity: Not Currently    Birth control/protection: Post-menopausal  Other Topics Concern   Not on file  Social History Narrative   Not on file   Social Determinants of Health   Financial Resource Strain: Not on file  Food Insecurity: No Food Insecurity (07/22/2022)   Hunger Vital Sign    Worried About Running Out of Food in the Last Year: Never true    Ran Out of Food in the Last Year: Never true  Transportation Needs: No Transportation Needs (07/22/2022)   PRAPARE - Administrator, Civil Service (Medical): No    Lack of Transportation (Non-Medical): No  Physical Activity: Not on file  Stress: Not on file  Social Connections: Not on  file  Intimate Partner Violence: Not At Risk (07/22/2022)   Humiliation, Afraid, Rape, and Kick questionnaire    Fear of Current or Ex-Partner: No    Emotionally Abused: No    Physically Abused: No    Sexually Abused: No    Review of Systems: See HPI, otherwise negative ROS  Physical Exam: BP 107/68   Pulse 83   Temp 98.4 F (36.9 C) (Oral)   Resp 11   Ht 5\' 4"  (1.626 m)   Wt 62.6 kg   LMP 05/05/2015   SpO2 97%   BMI 23.69 kg/m  General:   Alert,  Well-developed, well-nourished, pleasant and cooperative in NAD cervical adenopathy. Lungs:  Clear throughout to auscultation.   No wheezes, crackles, or rhonchi. No acute distress. Heart:  Regular rate and rhythm; no murmurs, clicks, rubs,  or gallops. Abdomen: Non-distended, normal bowel sounds.  Soft and nontender without appreciable mass or hepatosplenomegaly.  Pulses:  Normal pulses noted. Extremities:  Without clubbing or edema.  Impression/Plan:    62 year old lady with  short segment Barrett's here for surveillance  EGD.    No history of dysplasia.  No dysphagia.  She is here for surveillance EGD. The risks, benefits, limitations, alternatives and imponderables have been reviewed with the patient. Potential for esophageal dilation, biopsy, etc. have also been reviewed.  Questions have been answered. All parties agreeable.      Notice: This dictation was prepared with Dragon dictation along with smaller phrase technology. Any transcriptional errors that result from this process are unintentional and may not be corrected upon review.

## 2023-06-05 ENCOUNTER — Ambulatory Visit (HOSPITAL_BASED_OUTPATIENT_CLINIC_OR_DEPARTMENT_OTHER)
Admission: RE | Admit: 2023-06-05 | Discharge: 2023-06-05 | Disposition: A | Discharge status: Home / Self Care | Payer: BC Managed Care – PPO | Source: Ambulatory Visit | Attending: Urology | Admitting: Urology

## 2023-06-05 ENCOUNTER — Ambulatory Visit (HOSPITAL_BASED_OUTPATIENT_CLINIC_OR_DEPARTMENT_OTHER): Payer: BC Managed Care – PPO

## 2023-06-05 DIAGNOSIS — K4090 Unilateral inguinal hernia, without obstruction or gangrene, not specified as recurrent: Secondary | ICD-10-CM | POA: Insufficient documentation

## 2023-06-05 DIAGNOSIS — R109 Unspecified abdominal pain: Secondary | ICD-10-CM | POA: Diagnosis present

## 2023-06-05 DIAGNOSIS — K429 Umbilical hernia without obstruction or gangrene: Secondary | ICD-10-CM | POA: Diagnosis not present

## 2023-06-05 DIAGNOSIS — M545 Low back pain, unspecified: Secondary | ICD-10-CM | POA: Insufficient documentation

## 2023-06-05 DIAGNOSIS — R1030 Lower abdominal pain, unspecified: Secondary | ICD-10-CM | POA: Insufficient documentation

## 2023-06-05 DIAGNOSIS — N3289 Other specified disorders of bladder: Secondary | ICD-10-CM | POA: Diagnosis not present

## 2023-06-05 DIAGNOSIS — N858 Other specified noninflammatory disorders of uterus: Secondary | ICD-10-CM | POA: Diagnosis not present

## 2023-06-06 LAB — SURGICAL PATHOLOGY

## 2023-06-08 ENCOUNTER — Encounter (HOSPITAL_COMMUNITY): Payer: Self-pay | Admitting: Internal Medicine

## 2023-06-08 ENCOUNTER — Other Ambulatory Visit (HOSPITAL_COMMUNITY): Payer: BC Managed Care – PPO

## 2023-06-09 ENCOUNTER — Telehealth: Payer: Self-pay

## 2023-06-09 NOTE — Telephone Encounter (Signed)
[facility]: Order ID: [id number]       Authorized  Approval Valid Through: 06/09/2023 - 07/08/2023

## 2023-06-09 NOTE — Telephone Encounter (Signed)
Patient left a voice message 06-09-2023.  Wanting to know results from recent CAT scan.  Please advise.

## 2023-06-11 ENCOUNTER — Other Ambulatory Visit: Payer: BC Managed Care – PPO

## 2023-06-13 ENCOUNTER — Other Ambulatory Visit: Payer: Self-pay | Admitting: Urology

## 2023-06-13 DIAGNOSIS — N3289 Other specified disorders of bladder: Secondary | ICD-10-CM

## 2023-06-13 NOTE — Progress Notes (Signed)
Please let pt know CT stone on 06/05/2023 showed nothing to explain her intermittent right low back pain x3 month - no stones, masses, or hydronephrosis. She has diffuse bladder wall thickening though; urine culture on 05/03/23 was positive & treated - recommend that she stop by the lab for repeat urine culture. Order placed. If that is negative then cystoscopy will be advised. Thanks.

## 2023-06-15 ENCOUNTER — Ambulatory Visit: Payer: BC Managed Care – PPO

## 2023-06-15 ENCOUNTER — Telehealth: Payer: Self-pay

## 2023-06-15 DIAGNOSIS — N3289 Other specified disorders of bladder: Secondary | ICD-10-CM | POA: Diagnosis not present

## 2023-06-15 DIAGNOSIS — N390 Urinary tract infection, site not specified: Secondary | ICD-10-CM

## 2023-06-15 LAB — URINALYSIS, ROUTINE W REFLEX MICROSCOPIC
Bilirubin, UA: NEGATIVE
Glucose, UA: NEGATIVE
Ketones, UA: NEGATIVE
Nitrite, UA: NEGATIVE
Protein,UA: NEGATIVE
Specific Gravity, UA: 1.01 (ref 1.005–1.030)
Urobilinogen, Ur: 0.2 mg/dL (ref 0.2–1.0)
pH, UA: 7 (ref 5.0–7.5)

## 2023-06-15 LAB — MICROSCOPIC EXAMINATION

## 2023-06-15 NOTE — Telephone Encounter (Signed)
Patient presents today with complaints of  UTI.  UA and Culture done today.  Dr. Evette Georges reviewed results and pending urine culture .  Patient aware of MD recommendations and that we will reach out with culture results.      [id number], CMA

## 2023-06-15 NOTE — Progress Notes (Addendum)
Patient presents today with complaints of  UTI.  UA and Culture done today.  Dr. Evette Georges reviewed results and pending urine culture .  Patient aware of MD recommendations and that we will reach out with culture results.      [id number], CMA

## 2023-06-16 ENCOUNTER — Other Ambulatory Visit: Payer: Self-pay | Admitting: Urology

## 2023-06-16 DIAGNOSIS — N390 Urinary tract infection, site not specified: Secondary | ICD-10-CM

## 2023-06-16 MED ORDER — NITROFURANTOIN MONOHYD MACRO 100 MG PO CAPS
100.0000 mg | ORAL_CAPSULE | Freq: Two times a day (BID) | ORAL | 0 refills | Status: AC
Start: 2023-06-16 — End: 2023-06-23

## 2023-06-20 ENCOUNTER — Telehealth: Payer: Self-pay

## 2023-06-20 NOTE — Telephone Encounter (Signed)
Pt needing a call back regarding recent results and was advised to stop medication.    Please advise.

## 2023-06-20 NOTE — Telephone Encounter (Signed)
Return call to patient. Patient states that she is concern about WBC and RBC in her urine. Patient is aware that a message will be sent to Sarah on recommendation.  Patient voiced understanding.

## 2023-06-21 NOTE — Telephone Encounter (Signed)
Tried calling patient with no answer , left voice message for return call. "Office cystoscopy advised for further evaluation due to microscopic hematuria with negative urine culture. Please schedule (1st available). Thanks."

## 2023-06-27 NOTE — Telephone Encounter (Signed)
Tried call patient with patient with no answer and unable to leave voice message because mail box is full

## 2023-07-05 NOTE — Telephone Encounter (Signed)
Autry.Rack, I sent the patient a my chart message of Maralyn Sago recommendation and that an appointment had been scheduled for her to confirm. Please follow up with patient that the my chart message has been read. If not, the appointment will need to be cancelled since the patient has not confirmed with anyone in the office.  Thanks, Marchelle Folks

## 2023-07-06 NOTE — Telephone Encounter (Signed)
Tried calling patient with no answer, left vm for return call 

## 2023-07-27 ENCOUNTER — Other Ambulatory Visit: Payer: BC Managed Care – PPO | Admitting: Urology

## 2023-07-27 ENCOUNTER — Ambulatory Visit: Payer: BC Managed Care – PPO | Admitting: Urology

## 2023-07-27 VITALS — BP 108/71 | HR 68

## 2023-07-27 DIAGNOSIS — N3289 Other specified disorders of bladder: Secondary | ICD-10-CM | POA: Diagnosis not present

## 2023-07-27 DIAGNOSIS — N390 Urinary tract infection, site not specified: Secondary | ICD-10-CM

## 2023-07-27 LAB — MICROSCOPIC EXAMINATION: Bacteria, UA: NONE SEEN

## 2023-07-27 LAB — URINALYSIS, ROUTINE W REFLEX MICROSCOPIC
Bilirubin, UA: NEGATIVE
Glucose, UA: NEGATIVE
Ketones, UA: NEGATIVE
Nitrite, UA: NEGATIVE
Protein,UA: NEGATIVE
RBC, UA: NEGATIVE
Specific Gravity, UA: 1.02 (ref 1.005–1.030)
Urobilinogen, Ur: 1 mg/dL (ref 0.2–1.0)
pH, UA: 7 (ref 5.0–7.5)

## 2023-07-27 MED ORDER — SULFAMETHOXAZOLE-TRIMETHOPRIM 800-160 MG PO TABS: 1.0000 | ORAL_TABLET | Freq: Two times a day (BID) | ORAL | 0 refills | Status: AC

## 2023-07-27 MED ORDER — CIPROFLOXACIN HCL 500 MG PO TABS
500.0000 mg | ORAL_TABLET | Freq: Once | ORAL | Status: AC
Start: 2023-07-27 — End: 2023-07-27
  Administered 2023-07-27: 500 mg via ORAL

## 2023-07-27 MED ORDER — GEMTESA 75 MG PO TABS: 1.0000 | ORAL_TABLET | Freq: Every day | ORAL | Status: AC

## 2023-07-27 NOTE — Progress Notes (Signed)
   07/27/23  CC: bladder wall thickening   HPI: Vanessa Stokes is a 61yo here for cystoscopy for bladder wall thickening Blood pressure 108/71, pulse 68, last menstrual period 05/05/2015. NED. A&Ox3.   No respiratory distress   Abd soft, NT, ND Normal external genitalia with patent urethral meatus  Cystoscopy Procedure Note  Patient identification was confirmed, informed consent was obtained, and patient was prepped using Betadine solution.  Lidocaine jelly was administered per urethral meatus.    Procedure: - Flexible cystoscope introduced, without any difficulty.   - Thorough search of the bladder revealed:    normal urethral meatus    normal urothelium    no stones    no ulcers     no tumors    no urethral polyps    no trabeculation  - Ureteral orifices were normal in position and appearance.  Post-Procedure: - Patient tolerated the procedure well  Assessment/ Plan: Gemtesa 75mg  daily for OAB. Followup 6 months with UA   No follow-ups on file.  Wilkie Aye, MD

## 2023-08-02 ENCOUNTER — Encounter: Payer: Self-pay | Admitting: Urology

## 2023-08-02 NOTE — Patient Instructions (Signed)

## 2023-08-06 NOTE — Telephone Encounter (Signed)
Upon reviewing this chart, I do not see where a letter was sent or patient was informed of biopsy results from EGD.  Please call patient and make sure she knows that esophageal biopsies were benign very small segment of Barrett's.  Some reflux inflammation.  Recommend stay on Protonix 40 mg daily office visit is planned  Given short segment and stability over 10 years, we can wait 5 years before rechecking her esophagus.

## 2023-08-08 NOTE — Telephone Encounter (Signed)
Lmom for pt to return call

## 2023-08-10 NOTE — Telephone Encounter (Signed)
Daugter was made aware and verbalized understanding.

## 2023-08-11 ENCOUNTER — Telehealth: Payer: Self-pay | Admitting: Gastroenterology

## 2023-08-11 NOTE — Telephone Encounter (Signed)
This lady left a message saying she keeps getting messages from [place] to call .... I saw the note where you spoke to her daughter.  That is all the patient says "I have several messages from you".  I don't see where there were other calls made to her.

## 2023-08-11 NOTE — Telephone Encounter (Signed)
Pt was made aware as well and verbalized understanding.

## 2023-08-11 NOTE — Telephone Encounter (Signed)
Spoke with pt and gave her, her biopsy results from her recent EGD.

## 2023-08-17 ENCOUNTER — Encounter: Payer: Self-pay | Admitting: Gastroenterology

## 2023-09-28 ENCOUNTER — Ambulatory Visit: Payer: BC Managed Care – PPO | Admitting: Urology

## 2023-09-28 ENCOUNTER — Other Ambulatory Visit: Payer: BC Managed Care – PPO | Admitting: Urology

## 2023-09-28 VITALS — BP 111/72 | HR 71

## 2023-09-28 DIAGNOSIS — N3289 Other specified disorders of bladder: Secondary | ICD-10-CM

## 2023-09-28 DIAGNOSIS — N3281 Overactive bladder: Secondary | ICD-10-CM | POA: Diagnosis not present

## 2023-09-28 NOTE — Progress Notes (Signed)
09/28/2023 4:08 PM   Vanessa Stokes 07/13/1961 [id number]  Referring provider: Assunta Found, MD [address] [place],  [place] [address]  Followup OAB   HPI: [name] is a 62yo here for followup fro OAB. She took gemtesa 75mg  daily which improved her urgency and frequency but she has since stopped the medication. Her urinary urgency and frequency have not returned. UA shows no RBCs. No other complaints today   PMH: 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HCC)    Anemia    Bloated abdomen 05/12/2015   Depression    Paxil   Dysmenorrhea 05/12/2015   Fatigue 05/12/2015   Fibroid 05/15/2015   Fibroids 05/19/2015   Menometrorrhagia 05/12/2015   Pancreatic lesion 11/30/2017   Density in pancreatic head needs F/U in 1 year    Postmenopausal 05/15/2015   Postmenopausal bleeding 05/19/2015   Vaginal wall cyst 05/12/2015    Surgical History: Past Surgical History:  Procedure Laterality Date   BIOPSY  12/07/2019   Procedure: BIOPSY;  Surgeon: Corbin Ade, MD;  [facility];  Service: Endoscopy;;   BIOPSY  06/02/2023   Procedure: BIOPSY;  Surgeon: Corbin Ade, MD;  [facility]: [facility];  Service: Endoscopy;;   BRAIN SURGERY     COLONOSCOPY  12/04/2008   Dr. Rourk:Friable anal canal and anal papilla, otherwise normal rectum/Left-sided diverticula polyp at the splenic flexure, status post cold snared.  The remainder of the colonic mucosa and terminal  ileum mucosa appeared normal. BENIGN polyp.    COLONOSCOPY, ESOPHAGOGASTRODUODENOSCOPY (EGD) AND ESOPHAGEAL DILATION N/A 01/28/2014   Dr. Jena Gauss: Erosive reflux esophagitis, biopsy-proven short segment Barrett's esophagus with no dysplasia, status post Maloney dilation, hernia, gastric erosions with benign biopsy/no H. pylori, benign small bowel biopsies.  Normal ileocolonoscopy.  Plans to repeat EGD in 1 year and colonoscopy in 10 years.   ESOPHAGOGASTRODUODENOSCOPY N/A 12/07/2019   Rourk: Barrett's  esophagus with no dysplasia, noncritical Schatzki ring not manipulated.  Next EGD in January 2024.   ESOPHAGOGASTRODUODENOSCOPY (EGD) WITH PROPOFOL N/A 06/02/2023   Procedure: ESOPHAGOGASTRODUODENOSCOPY (EGD) WITH PROPOFOL;  Surgeon: Corbin Ade, MD;  [facility];  Service: Endoscopy;  Laterality: N/A;  2:30 pm, asa 2   pituitary gland tumors removed     X 2    Home Medications:  Allergies as of 09/28/2023   No Known Allergies      Medication List        Accurate as of September 28, 2023  4:08 PM. If you have any questions, ask your nurse or doctor.          ALPRAZolam 0.5 MG tablet Commonly known as: XANAX Take 0.5 mg by mouth daily as needed for anxiety.   diphenhydramine-acetaminophen 25-500 MG Tabs tablet Commonly known as: TYLENOL PM Take 1 tablet by mouth at bedtime as needed (sleep).   Gemtesa 75 MG Tabs Generic drug: Vibegron Take 1 tablet (75 mg total) by mouth daily.   pantoprazole 40 MG tablet Commonly known as: PROTONIX Take 1 tablet (40 mg total) by mouth daily.   PARoxetine 20 MG tablet Commonly known as: PAXIL Take 20 mg by mouth every morning.   sulfamethoxazole-trimethoprim 800-160 MG tablet Commonly known as: BACTRIM DS Take 1 tablet by mouth every 12 (twelve) hours.        Allergies: No Known Allergies  Family History: Family History  Problem Relation Age of Onset   Cancer Mother        lung  Cancer Father        lung   Other Daughter        hip problems; degenerative hip dysplasia   Pancreatic cancer Daughter        pancreatic tail, age 18.   Colon cancer Neg Hx     Social History:  reports that she has been smoking cigarettes. She has never used smokeless tobacco. She reports that she does not currently use alcohol. She reports that she does not use drugs.  ROS: All other review of systems were reviewed and are negative except what is noted above in HPI  Physical Exam: BP 111/72   Pulse 71   LMP 05/05/2015    Constitutional:  Alert and oriented, No acute distress. HEENT: [place] AT, moist mucus membranes.  Trachea midline, no masses. Cardiovascular: No clubbing, cyanosis, or edema. Respiratory: Normal respiratory effort, no increased work of breathing. GI: Abdomen is soft, nontender, nondistended, no abdominal masses GU: No CVA tenderness.  Lymph: No cervical or inguinal lymphadenopathy. Skin: No rashes, bruises or suspicious lesions. Neurologic: Grossly intact, no focal deficits, moving all 4 extremities. Psychiatric: Normal mood and affect.  Laboratory Data: Lab Results  Component Value Date   WBC 4.4 09/23/2021   HGB 12.5 09/23/2021   HCT 36.9 09/23/2021   MCV 90.9 09/23/2021   PLT 221 09/23/2021    Lab Results  Component Value Date   CREATININE 0.47 03/24/2021    No results found for: "PSA"  No results found for: "TESTOSTERONE"  No results found for: "HGBA1C"  Urinalysis    Component Value Date/Time   COLORURINE YELLOW 03/24/2021 0353   APPEARANCEUR Clear 07/27/2023 0859   LABSPEC 1.020 03/24/2021 0353   PHURINE 5.0 03/24/2021 0353   GLUCOSEU Negative 07/27/2023 0859   HGBUR MODERATE (A) 03/24/2021 0353   BILIRUBINUR Negative 07/27/2023 0859   KETONESUR NEGATIVE 03/24/2021 0353   PROTEINUR Negative 07/27/2023 0859   PROTEINUR NEGATIVE 03/24/2021 0353   UROBILINOGEN 0.2 12/14/2007 1924   NITRITE Negative 07/27/2023 0859   NITRITE NEGATIVE 03/24/2021 0353   LEUKOCYTESUR 2+ (A) 07/27/2023 0859   LEUKOCYTESUR LARGE (A) 03/24/2021 0353    Lab Results  Component Value Date   LABMICR See below: 07/27/2023   WBCUA 6-10 (A) 07/27/2023   LABEPIT 0-10 07/27/2023   BACTERIA None seen 07/27/2023    Pertinent Imaging:  Results for orders placed during the hospital encounter of 05/12/23  DG Abd 1 View  Narrative CLINICAL DATA:  Nephrolithiasis.  EXAM: ABDOMEN - 1 VIEW  COMPARISON:  None Available.  FINDINGS: The bowel gas pattern is normal. 11 mm calcification  overlies the right kidney. Presumably this represents nephrolithiasis which can be confirmed with noncontrast CT. Several calcifications identified in the pelvis which are nonspecific and likely phleboliths.  IMPRESSION: Calcification consistent with stone overlying the right kidney. Possible phleboliths versus ureteral stones in the pelvis. Noncontrast CT may be helpful for further evaluation.   Electronically Signed By: Layla Maw M.D. On: 05/17/2023 18:33  No results found for this or any previous visit.  No results found for this or any previous visit.  No results found for this or any previous visit.  Results for orders placed during the hospital encounter of 05/12/23  US RENAL  Narrative CLINICAL DATA:  Evaluate renal stones  EXAM: RENAL / URINARY TRACT ULTRASOUND COMPLETE  COMPARISON:  MR abdomen 09/11/2021  FINDINGS: Right Kidney:  Renal measurements: 9.8 x 4.7 x 4.8 cm = volume: 114.0 mL. Echogenicity within normal limits. No  mass or hydronephrosis visualized.  Left Kidney:  Renal measurements: 11.1 x 5.8 x 4.7 cm = volume: 157.6 mL. Echogenicity within normal limits. No mass or hydronephrosis visualized.  Bladder:  Appears normal for degree of bladder distention.  Other:  None.  IMPRESSION: No hydronephrosis.   Electronically Signed By: Annia Belt M.D. On: 05/12/2023 13:33  No valid procedures specified. No results found for this or any previous visit.  Results for orders placed during the hospital encounter of 06/05/23  CT RENAL STONE STUDY  Narrative CLINICAL DATA:  Right-sided low back pain with sciatica. Stone suspected.  EXAM: CT ABDOMEN AND PELVIS WITHOUT CONTRAST  TECHNIQUE: Multidetector CT imaging of the abdomen and pelvis was performed following the standard protocol without IV contrast.  RADIATION DOSE REDUCTION: This exam was performed according to the departmental dose-optimization program which includes  automated exposure control, adjustment of the mA and/or kV according to patient size and/or use of iterative reconstruction technique.  COMPARISON:  03/24/2021.  FINDINGS: Lower chest: No acute abnormality.  Hepatobiliary: No focal liver abnormality is seen. No gallstones, gallbladder wall thickening, or biliary dilatation.  Pancreas: Unremarkable. No pancreatic ductal dilatation or surrounding inflammatory changes.  Spleen: Normal in size without focal abnormality.  Adrenals/Urinary Tract: The adrenal glands are within normal limits. No renal calculus or obstructive uropathy bilaterally. There is diffuse bladder wall thickening.  Stomach/Bowel: Stomach is within normal limits. Appendix is not seen. No evidence of bowel wall thickening, distention, or inflammatory changes. No free air or pneumatosis.  Vascular/Lymphatic: No significant vascular findings are present. No enlarged abdominal or pelvic lymph nodes.  Reproductive: Coarse calcification is present in the uterus. No adnexal mass.  Other: No abdominopelvic ascites. A fat containing inguinal hernias noted on the left. Small fat containing umbilical hernia is present.  Musculoskeletal: Degenerative changes are present in the thoracolumbar spine. No acute osseous abnormality.  IMPRESSION: 1. Diffuse bladder wall thickening, possible infectious or inflammatory cystitis. 2. No renal calculus or obstructive uropathy bilaterally.   Electronically Signed By: Thornell Sartorius M.D. On: 06/11/2023 00:38   Assessment & Plan:    1. OAB (overactive bladder) Resolved after treating UTIs - Urinalysis, Routine w reflex microscopic  2. Bladder lesion -followup 6 months with cystoscopy    No follow-ups on file.  Wilkie Aye, MD  [facility] [place]

## 2023-09-29 LAB — MICROSCOPIC EXAMINATION: Bacteria, UA: NONE SEEN

## 2023-09-29 LAB — URINALYSIS, ROUTINE W REFLEX MICROSCOPIC
Bilirubin, UA: NEGATIVE
Glucose, UA: NEGATIVE
Ketones, UA: NEGATIVE
Nitrite, UA: NEGATIVE
Protein,UA: NEGATIVE
Specific Gravity, UA: 1.025 (ref 1.005–1.030)
Urobilinogen, Ur: 0.2 mg/dL (ref 0.2–1.0)
pH, UA: 6 (ref 5.0–7.5)

## 2023-10-02 ENCOUNTER — Encounter: Payer: Self-pay | Admitting: Urology

## 2023-10-02 NOTE — Patient Instructions (Signed)

## 2023-11-03 ENCOUNTER — Ambulatory Visit (HOSPITAL_COMMUNITY)
Admission: RE | Admit: 2023-11-03 | Discharge: 2023-11-03 | Disposition: A | Discharge status: Home / Self Care | Payer: BC Managed Care – PPO | Source: Ambulatory Visit | Attending: Family Medicine | Admitting: Family Medicine

## 2023-11-03 ENCOUNTER — Other Ambulatory Visit (HOSPITAL_COMMUNITY): Payer: Self-pay | Admitting: Family Medicine

## 2023-11-03 DIAGNOSIS — M79672 Pain in left foot: Secondary | ICD-10-CM | POA: Insufficient documentation

## 2023-11-03 DIAGNOSIS — Z6824 Body mass index (BMI) 24.0-24.9, adult: Secondary | ICD-10-CM | POA: Diagnosis not present

## 2023-11-03 DIAGNOSIS — M2012 Hallux valgus (acquired), left foot: Secondary | ICD-10-CM | POA: Diagnosis not present

## 2023-11-03 DIAGNOSIS — M25572 Pain in left ankle and joints of left foot: Secondary | ICD-10-CM | POA: Diagnosis not present

## 2023-11-03 DIAGNOSIS — M19072 Primary osteoarthritis, left ankle and foot: Secondary | ICD-10-CM | POA: Diagnosis not present

## 2024-02-01 ENCOUNTER — Encounter (HOSPITAL_COMMUNITY): Payer: Self-pay | Admitting: Physician Assistant

## 2024-02-01 ENCOUNTER — Other Ambulatory Visit (HOSPITAL_COMMUNITY): Payer: Self-pay | Admitting: Physician Assistant

## 2024-02-01 DIAGNOSIS — M79661 Pain in right lower leg: Secondary | ICD-10-CM

## 2024-02-01 DIAGNOSIS — M79651 Pain in right thigh: Secondary | ICD-10-CM

## 2024-02-02 ENCOUNTER — Ambulatory Visit (HOSPITAL_COMMUNITY)
Admission: RE | Admit: 2024-02-02 | Discharge: 2024-02-02 | Disposition: A | Discharge status: Home / Self Care | Source: Ambulatory Visit | Attending: Physician Assistant | Admitting: Physician Assistant

## 2024-02-02 DIAGNOSIS — M79651 Pain in right thigh: Secondary | ICD-10-CM | POA: Insufficient documentation

## 2024-02-02 DIAGNOSIS — M79661 Pain in right lower leg: Secondary | ICD-10-CM | POA: Insufficient documentation

## 2024-03-28 ENCOUNTER — Other Ambulatory Visit: Payer: BC Managed Care – PPO | Admitting: Urology

## 2024-04-23 ENCOUNTER — Telehealth: Payer: Self-pay | Admitting: Gastroenterology

## 2024-04-23 DIAGNOSIS — K869 Disease of pancreas, unspecified: Secondary | ICD-10-CM

## 2024-04-23 NOTE — Telephone Encounter (Signed)
 I received a notification from epic that her MRIabd with and without contrast order for 04/2023 is getting ready to expire. She did not complete after my ov with her last year.   We need to try and get her rescheduled for it due to h/o pancreatic lesion.

## 2024-04-24 NOTE — Telephone Encounter (Signed)
 Pt was made aware that MRI will be rescheduled.

## 2024-04-25 NOTE — Telephone Encounter (Addendum)
 PA approved via [facility] but will have to be done at [facility] per insurance requirements. [facility] [facility] is aware of order and will call patient to schedule.   Order ID: [id number]       Authorized  Approval Valid Through: 04/25/2024 - 05/24/2024

## 2024-04-25 NOTE — Addendum Note (Signed)
 Addended by: Feliz Hosteller on: 04/25/2024 08:49 AM   Modules accepted: Orders

## 2024-05-09 ENCOUNTER — Encounter: Payer: Self-pay | Admitting: Urology

## 2024-05-09 ENCOUNTER — Ambulatory Visit: Admitting: Urology

## 2024-05-09 VITALS — BP 104/70 | HR 89

## 2024-05-09 DIAGNOSIS — N329 Bladder disorder, unspecified: Secondary | ICD-10-CM | POA: Diagnosis not present

## 2024-05-09 LAB — MICROSCOPIC EXAMINATION: Bacteria, UA: NONE SEEN

## 2024-05-09 LAB — URINALYSIS, ROUTINE W REFLEX MICROSCOPIC
Bilirubin, UA: NEGATIVE
Glucose, UA: NEGATIVE
Ketones, UA: NEGATIVE
Nitrite, UA: NEGATIVE
Protein,UA: NEGATIVE
Specific Gravity, UA: 1.02 (ref 1.005–1.030)
Urobilinogen, Ur: 1 mg/dL (ref 0.2–1.0)
pH, UA: 6 (ref 5.0–7.5)

## 2024-05-09 MED ORDER — CIPROFLOXACIN HCL 500 MG PO TABS
500.0000 mg | ORAL_TABLET | Freq: Once | ORAL | Status: AC
  Administered 2024-05-09: 500 mg via ORAL

## 2024-05-09 NOTE — Progress Notes (Signed)
   05/09/24  CC: followup bladder lesion   HPI: Ms Guidry is a 63yo here for followup for a bladder lesion and OAb. She has stopped the gemtesa  since last visit. She denies nay worsening LUTS.  Blood pressure 104/70, pulse 89, last menstrual period 05/05/2015. NED. A&Ox3.   No respiratory distress   Abd soft, NT, ND Normal external genitalia with patent urethral meatus  Cystoscopy Procedure Note  Patient identification was confirmed, informed consent was obtained, and patient was prepped using Betadine solution.  Lidocaine  jelly was administered per urethral meatus.    Procedure: - Flexible cystoscope introduced, without any difficulty.   - Thorough search of the bladder revealed:    normal urethral meatus    normal urothelium    no stones    no ulcers     no tumors    no urethral polyps    no trabeculation  - Ureteral orifices were normal in position and appearance.  Post-Procedure: - Patient tolerated the procedure well  Assessment/ Plan: Followup 1 year with UA. She does not require repeat cystoscopy since the lesion has resolved   No follow-ups on file.  Johnie Nailer, MD

## 2024-05-09 NOTE — Patient Instructions (Signed)
 Cystoscopy A cystoscopy may be done to find or treat a condition in your lower urinary tract. Your lower urinary tract includes your bladder and urethra. The urethra is the part of your body that drains pee (urine) from your bladder. You may need this procedure if: You have: Urinary tract infections (UTIs) that keep coming back. Blood in your pee. Pain when you pee. A blockage in your urethra, such as a urinary stone. You can't control when you pee, or you have to pee a lot. There are cells that aren't normal in your pee sample. A problem is found in your bladder during a test. You need a biopsy. This is when a small piece of tissue is removed for testing. Tell a health care provider about: Any allergies you have. All medicines you take. These include vitamins, herbs, eye drops, and creams. Any problems you or family members have had with anesthesia. Any bleeding problems you have. Any surgeries you've had. Any medical conditions you have. Whether you're pregnant or may be pregnant. What are the risks? Your health care provider will talk with you about risks. These may include: Infection. Bleeding. Allergic reactions to medicines. Damage to your urethra or bladder. What happens before the procedure? Medicines Ask about changing or stopping: Any medicines you take. Any vitamins, herbs, or supplements you take. Do not take aspirin or ibuprofen unless you're told to. Tests You may have an exam or tests. These may include: Pee tests to check for signs of infection. X-rays of: Your bladder. Your urethra. Your kidneys. A CT scan of your belly or hips. General instructions Eat and drink only as you've been told. For your safety, you may: Need to wash your skin with a soap that kills germs. Get antibiotics. Have hair removed at the procedure site. If you'll be going home right after the procedure, plan to have a responsible adult: Take you home from the hospital or clinic. You  won't be allowed to drive. Stay with you for the time you're told. What happens during the procedure?  You may be given: A sedative to help you relax. Anesthesia to keep you from feeling pain. The opening of your urethra will be cleaned. A thin tube called a cystoscope will be put into your urethra. The tube has a light and camera on the end of it. The tube will be passed into your bladder. A germ-free (sterile) fluid will flow through the tube. The fluid will stretch your bladder. This helps your provider see the walls of your bladder more clearly. A biopsy may be taken. Stones may be removed. The tube will be taken out. Your bladder will be emptied. The procedure may vary among providers and hospitals. What can I expect after the procedure? It's common to have: Some soreness or pain in your belly and urethra. Mild pain or burning when you pee. The pain should stop a few minutes after you pee. This may last for up to a week. A small amount of blood in your pee for a few days. A feeling like you need to pee often. But when you do, you may only pee a little. Follow these instructions at home: Medicines Take your medicines only as told. If you were given antibiotics, take them as told. Do not stop taking them even if you start to feel better. General instructions If you were given a sedative, do not drive or use machines until you're told it's safe. A sedative can make you sleepy. Eat and drink  as told. If a biopsy was taken, ask when your test results will be ready and how to get them. You may need to call or meet with your provider to get your results. Ask what things are safe for you to do at home. Ask when you can go back to work or school. Contact a health care provider if: Your pain gets worse. Your pain doesn't get better with medicine. You have trouble peeing. You have more blood in your pee. You have a fever or chills. Get help right away if: You have blood clots in your  pee. You can't pee. This information is not intended to replace advice given to you by your health care provider. Make sure you discuss any questions you have with your health care provider. Document Revised: 03/15/2023 Document Reviewed: 03/15/2023 [employer] Patient Education  2024 [employer].

## 2024-06-05 NOTE — Addendum Note (Signed)
 Addended by: JEANELL GRAEME RAMAN on: 06/05/2024 03:39 PM   Modules accepted: Orders

## 2024-06-05 NOTE — Telephone Encounter (Addendum)
 Patient stated she never heard regarding getting her MRI scheduled. Looks like the order cancelled her order.  New order placed and new PA done as previous expired  Order ID: [id number]       Authorized Approval Valid Through: 06/05/2024 - 07/04/2024

## 2024-06-30 ENCOUNTER — Ambulatory Visit
Admission: RE | Admit: 2024-06-30 | Discharge: 2024-06-30 | Disposition: A | Discharge status: Home / Self Care | Source: Ambulatory Visit | Attending: Gastroenterology | Admitting: Gastroenterology

## 2024-06-30 DIAGNOSIS — K869 Disease of pancreas, unspecified: Secondary | ICD-10-CM

## 2024-06-30 DIAGNOSIS — K862 Cyst of pancreas: Secondary | ICD-10-CM | POA: Diagnosis not present

## 2024-06-30 MED ORDER — GADOPICLENOL 0.5 MMOL/ML IV SOLN
6.0000 mL | Freq: Once | INTRAVENOUS | Status: AC | PRN
  Administered 2024-06-30: 6 mL via INTRAVENOUS

## 2024-07-15 ENCOUNTER — Ambulatory Visit: Payer: Self-pay | Admitting: Gastroenterology

## 2024-08-01 ENCOUNTER — Other Ambulatory Visit (HOSPITAL_COMMUNITY): Payer: Self-pay | Admitting: Family Medicine

## 2024-08-01 DIAGNOSIS — Z1231 Encounter for screening mammogram for malignant neoplasm of breast: Secondary | ICD-10-CM

## 2024-08-08 ENCOUNTER — Ambulatory Visit (HOSPITAL_COMMUNITY)

## 2024-12-05 ENCOUNTER — Other Ambulatory Visit: Payer: Self-pay | Admitting: Internal Medicine

## 2024-12-05 DIAGNOSIS — Z86018 Personal history of other benign neoplasm: Secondary | ICD-10-CM

## 2024-12-05 DIAGNOSIS — E220 Acromegaly and pituitary gigantism: Secondary | ICD-10-CM

## 2024-12-05 DIAGNOSIS — R519 Headache, unspecified: Secondary | ICD-10-CM

## 2025-01-01 ENCOUNTER — Other Ambulatory Visit

## 2025-05-10 ENCOUNTER — Ambulatory Visit: Admitting: Urology
